# Patient Record
Sex: Male | Born: 1955
Health system: Southern US, Community
[De-identification: ages and names within clinical notes are randomized; demographics above are authoritative.]

## PROBLEM LIST (undated history)

## (undated) DIAGNOSIS — M7120 Synovial cyst of popliteal space [Baker], unspecified knee: Secondary | ICD-10-CM

## (undated) HISTORY — DX: Synovial cyst of popliteal space (Baker), unspecified knee: M71.20

## (undated) HISTORY — PX: DENTAL RESTORATION/EXTRACTION WITH X-RAY: SHX5796

## (undated) HISTORY — PX: OTHER SURGICAL HISTORY: SHX169

---

## 2005-12-29 ENCOUNTER — Ambulatory Visit: Payer: Self-pay | Admitting: Internal Medicine

## 2006-01-05 ENCOUNTER — Ambulatory Visit: Payer: Self-pay | Admitting: Internal Medicine

## 2006-01-26 ENCOUNTER — Ambulatory Visit: Payer: Self-pay | Admitting: Gastroenterology

## 2006-02-10 ENCOUNTER — Ambulatory Visit: Payer: Self-pay | Admitting: Gastroenterology

## 2008-02-21 ENCOUNTER — Encounter: Payer: Self-pay | Admitting: Internal Medicine

## 2008-02-21 DIAGNOSIS — F411 Generalized anxiety disorder: Secondary | ICD-10-CM | POA: Insufficient documentation

## 2008-02-21 DIAGNOSIS — F329 Major depressive disorder, single episode, unspecified: Secondary | ICD-10-CM | POA: Insufficient documentation

## 2009-06-08 ENCOUNTER — Ambulatory Visit: Payer: Self-pay | Admitting: Internal Medicine

## 2009-06-08 LAB — CONVERTED CEMR LAB
ALT: 15 units/L (ref 0–53)
AST: 18 units/L (ref 0–37)
Alkaline Phosphatase: 58 units/L (ref 39–117)
Basophils Absolute: 0.1 10*3/uL (ref 0.0–0.1)
CO2: 31 meq/L (ref 19–32)
Calcium: 9.6 mg/dL (ref 8.4–10.5)
Creatinine, Ser: 1 mg/dL (ref 0.4–1.5)
Eosinophils Absolute: 0.2 10*3/uL (ref 0.0–0.7)
Glucose, Bld: 89 mg/dL (ref 70–99)
Lymphocytes Relative: 33.2 % (ref 12.0–46.0)
MCHC: 34.3 g/dL (ref 30.0–36.0)
Neutro Abs: 3.5 10*3/uL (ref 1.4–7.7)
Neutrophils Relative %: 52.6 % (ref 43.0–77.0)
Platelets: 307 10*3/uL (ref 150.0–400.0)
RDW: 13.9 % (ref 11.5–14.6)
TSH: 1.93 microintl units/mL (ref 0.35–5.50)
Total Bilirubin: 1.5 mg/dL — ABNORMAL HIGH (ref 0.3–1.2)
Total CHOL/HDL Ratio: 4
Triglycerides: 61 mg/dL (ref 0.0–149.0)

## 2010-01-18 ENCOUNTER — Ambulatory Visit: Payer: Self-pay | Admitting: Internal Medicine

## 2010-01-18 ENCOUNTER — Telehealth: Payer: Self-pay | Admitting: Internal Medicine

## 2010-01-18 DIAGNOSIS — M549 Dorsalgia, unspecified: Secondary | ICD-10-CM | POA: Insufficient documentation

## 2010-01-18 LAB — CONVERTED CEMR LAB
Basophils Relative: 0.7 % (ref 0.0–3.0)
Eosinophils Absolute: 0.1 10*3/uL (ref 0.0–0.7)
Lymphocytes Relative: 33.8 % (ref 12.0–46.0)
MCHC: 34.1 g/dL (ref 30.0–36.0)
Neutrophils Relative %: 49.2 % (ref 43.0–77.0)
RBC: 4.03 M/uL — ABNORMAL LOW (ref 4.22–5.81)
WBC: 6.7 10*3/uL (ref 4.5–10.5)

## 2010-01-20 ENCOUNTER — Telehealth: Payer: Self-pay | Admitting: Internal Medicine

## 2010-11-09 NOTE — Assessment & Plan Note (Signed)
Summary: lower back pain-lb   Vital Signs:  Patient profile:   55 year old male Height:      73 inches Weight:      189 pounds BMI:     25.03 O2 Sat:      97 % on Room air Temp:     97.1 degrees F oral Pulse rate:   64 / minute BP sitting:   102 / 78  (left arm) Cuff size:   regular  Vitals Entered By: Bill Salinas CMA (January 18, 2010 4:04 PM)  O2 Flow:  Room air CC: pt here with complaint of lower back pain since march 18th 2011/ ab   Primary Care Provider:  Casimiro Needle e. Faust Thorington  CC:  pt here with complaint of lower back pain since march 18th 2011/ ab.  History of Present Illness: Patient had a root canal March 17th and since then he has had back pain. He attributed this to being in the chair tensed for an hour. He had been started on ibuprofen after the procedure but after he stopped the ibuprofen the pain got worse. Even being very careful with his workouts he has seen no improvement in pain. Today taking 800mg  of ibuprofen he still hurts. He has had no fever or chills. He has some radiation of pain to the buttocks. No weakness. No paresthesia, no loss of control bowel or bladder. The pain is worse after sitting - pain with getting up.   Current Medications (verified): 1)  Multivitamins   Tabs (Multiple Vitamin) .... Take 1 By Mouth Qd 2)  Ibuprofen 200 Mg Tabs (Ibuprofen) .... 4 Tabs As Needed  Allergies (verified): No Known Drug Allergies  Past History:  Past Medical History: Last updated: 02/21/2008 Anxiety Depression  Past Surgical History: Last updated: 02/21/2008 Head skull fracture in 1980 PSH reviewed for relevance, FH reviewed for relevance  Review of Systems  The patient denies anorexia, fever, weight loss, weight gain, chest pain, dyspnea on exertion, peripheral edema, abdominal pain, muscle weakness, difficulty walking, and enlarged lymph nodes.    Physical Exam  General:  WNWD fit appearing white male in no distress Head:  Normocephalic and atraumatic  without obvious abnormalities. No apparent alopecia or balding. Lungs:  normal respiratory effort and normal breath sounds.   Heart:  normal rate and regular rhythm.   Msk:  back exam: nl stand,flex, toe/heel walk and gait, nl step-up, nl SLR sitting and laying, nl DTR patellar, no CVAT. Nl sensation. Pulses:  2+ radial and DP Extremities:  No clubbing, cyanosis, edema, or deformity noted with normal full range of motion of all joints.   Skin:  turgor normal and color normal.   Psych:  Oriented X3, good eye contact, and not anxious appearing.     Impression & Recommendations:  Problem # 1:  BACK PAIN (ICD-724.5) Suspect muscle strain. Exam without radicular findings.  Plan - r/o infection (recent dental work prior to pain) or inflammation           back exercises ( pamphlet provided)           OTC NSIADs as needed - GI precautions provided.  His updated medication list for this problem includes:    Ibuprofen 200 Mg Tabs (Ibuprofen) .Marland KitchenMarland KitchenMarland KitchenMarland Kitchen 4 tabs as needed  Orders: TLB-CBC Platelet - w/Differential (85025-CBCD) TLB-Sedimentation Rate (ESR) (85652-ESR)  Complete Medication List: 1)  Multivitamins Tabs (Multiple vitamin) .... Take 1 by mouth qd 2)  Ibuprofen 200 Mg Tabs (Ibuprofen) .... 4 tabs as needed  Patient: Scott Sherman Note: All result statuses are Final unless otherwise noted.  Tests: (1) CBC Platelet w/Diff (CBCD)   White Cell Count          6.7 K/uL                    4.5-10.5   Red Cell Count       [L]  4.03 Mil/uL                 4.22-5.81   Hemoglobin           [L]  12.6 g/dL                   04.5-40.9   Hematocrit           [L]  37.0 %                      39.0-52.0   MCV                       91.8 fl                     78.0-100.0   MCHC                      34.1 g/dL                   81.1-91.4   RDW                       14.1 %                      11.5-14.6   Platelet Count            285.0 K/uL                  150.0-400.0   Neutrophil %              49.2  %                      43.0-77.0   Lymphocyte %              33.8 %                      12.0-46.0   Monocyte %           [H]  14.1 %                      3.0-12.0   Eosinophils%              2.2 %                       0.0-5.0   Basophils %               0.7 %                       0.0-3.0   Neutrophill Absolute      3.3 K/uL                    1.4-7.7   Lymphocyte Absolute       2.3 K/uL  0.7-4.0   Monocyte Absolute         0.9 K/uL                    0.1-1.0  Eosinophils, Absolute                             0.1 K/uL                    0.0-0.7   Basophils Absolute        0.0 K/uL                    0.0-0.1  Tests: (2) Sed Rate (ESR)   Sed Rate                  19 mm/hr                    0-22

## 2010-11-09 NOTE — Progress Notes (Signed)
Summary: Needs OV  Phone Note Call from Patient   Summary of Call: Pt c/o low back pain since march 18th, getting worse. Pt needs office visit soon for eval w/Norins in open apt please.  Initial call taken by: Lamar Sprinkles, CMA,  January 18, 2010 11:09 AM  Follow-up for Phone Call        left pt a message to call office back. Follow-up by: Verdell Face,  January 18, 2010 11:42 AM  Additional Follow-up for Phone Call Additional follow up Details #1::        pt is here today Additional Follow-up by: Ami Bullins CMA,  January 18, 2010 3:40 PM

## 2010-11-09 NOTE — Progress Notes (Signed)
  Phone Note Outgoing Call   Reason for Call: Discuss lab or test results Summary of Call: please call: labs are 100% normal  (CBC, Sed rate)  Thanks Initial call taken by: Jacques Navy MD,  January 20, 2010 5:41 AM  Follow-up for Phone Call        informed pt of lab results Follow-up by: Ami Bullins CMA,  January 20, 2010 8:52 AM

## 2012-09-11 ENCOUNTER — Encounter: Payer: Self-pay | Admitting: Internal Medicine

## 2012-09-11 ENCOUNTER — Other Ambulatory Visit (INDEPENDENT_AMBULATORY_CARE_PROVIDER_SITE_OTHER): Payer: BC Managed Care – PPO

## 2012-09-11 ENCOUNTER — Ambulatory Visit (INDEPENDENT_AMBULATORY_CARE_PROVIDER_SITE_OTHER): Payer: BC Managed Care – PPO | Admitting: Internal Medicine

## 2012-09-11 VITALS — BP 116/80 | HR 77 | Temp 98.0°F | Resp 16 | Ht 73.0 in | Wt 197.2 lb

## 2012-09-11 DIAGNOSIS — Z0000 Encounter for general adult medical examination without abnormal findings: Secondary | ICD-10-CM

## 2012-09-11 DIAGNOSIS — Z125 Encounter for screening for malignant neoplasm of prostate: Secondary | ICD-10-CM

## 2012-09-11 DIAGNOSIS — Z23 Encounter for immunization: Secondary | ICD-10-CM

## 2012-09-11 LAB — HEPATIC FUNCTION PANEL
ALT: 14 U/L (ref 0–53)
AST: 19 U/L (ref 0–37)
Albumin: 4.2 g/dL (ref 3.5–5.2)
Total Protein: 6.9 g/dL (ref 6.0–8.3)

## 2012-09-11 LAB — COMPREHENSIVE METABOLIC PANEL
ALT: 14 U/L (ref 0–53)
Albumin: 4.2 g/dL (ref 3.5–5.2)
Alkaline Phosphatase: 56 U/L (ref 39–117)
BUN: 17 mg/dL (ref 6–23)
CO2: 28 mEq/L (ref 19–32)
Chloride: 102 mEq/L (ref 96–112)
Total Bilirubin: 1.2 mg/dL (ref 0.3–1.2)
Total Protein: 6.9 g/dL (ref 6.0–8.3)

## 2012-09-11 LAB — LIPID PANEL
Cholesterol: 200 mg/dL (ref 0–200)
LDL Cholesterol: 134 mg/dL — ABNORMAL HIGH (ref 0–99)
Triglycerides: 48 mg/dL (ref 0.0–149.0)

## 2012-09-11 LAB — PSA: PSA: 1.7 ng/mL (ref 0.10–4.00)

## 2012-09-11 NOTE — Patient Instructions (Addendum)
Normal exam. Labs are ordered and you will receive a full report from today's visit and lab results. You may also use MyChart  Tdap and flu today.  Good to see you. Looks like you landed on your feet!

## 2012-09-11 NOTE — Progress Notes (Signed)
Subjective:    Patient ID: Scott Sherman, male    DOB: Feb 17, 1956, 56 y.o.   MRN: [id number]  HPI Scott Sherman presents for annual DOT exam and physical exam. He has been healthy and doing well. Dr. Elmer Picker sees him for macular degeneration. He has seen the dentist in the last 6 months. Works out 1-1.5 hrs at [employer] 3 times a week. 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   Review of Systems Constitutional:  Negative for fever, chills, activity change and unexpected weight change.  HEENT:  Negative for hearing loss, ear pain, congestion, neck stiffness and postnasal drip. Negative for sore throat or swallowing problems. Negative for dental complaints.   Eyes: Negative for vision loss or change in visual acuity.  Respiratory: Negative for chest tightness and wheezing. Negative for DOE.   Cardiovascular: Negative for chest pain or palpitations. No decreased exercise tolerance Gastrointestinal: No change in bowel habit. No bloating or gas. No reflux or indigestion Genitourinary: Negative for urgency, frequency, flank pain and difficulty urinating.  Musculoskeletal: Negative for myalgias, back pain, arthralgias and gait problem.   Neurological: Negative for dizziness, tremors, weakness and headaches.  Hematological: Negative for adenopathy.  Psychiatric/Behavioral: Negative for behavioral problems and dysphoric mood.       Objective:   Physical Exam Filed Vitals:   09/11/12 1027  BP: 116/80  Pulse: 77  Temp: 98 F (36.7 C)  Resp: 16   Wt Readings from Last 3 Encounters:  09/11/12 197 lb 4 oz (89.472 kg)  01/18/10 189 lb (85.73 kg)  06/08/09 185 lb (83.915 kg)   Gen'l: Well nourished well developed White male in no acute distress  HEENT: Head: Normocephalic and atraumatic. Right Ear: External ear normal. EAC/TM nl. Left Ear: External ear normal.  EAC/TM nl. Nose: Nose normal. Mouth/Throat: Oropharynx is clear and moist. Dentition - native, in good repair. No buccal or palatal lesions. Posterior pharynx clear. Eyes: Conjunctivae and sclera clear. EOM intact. Pupils are equal, round, and reactive to light. Right eye exhibits no discharge. Left eye exhibits no discharge. Neck: Normal range of motion. Neck supple. No JVD present. No tracheal deviation present. No thyromegaly present.  Cardiovascular: Normal rate, regular rhythm, no gallop, no friction rub, no murmur heard.      Quiet precordium. 2+ radial and DP pulses . No carotid bruits Pulmonary/Chest: Effort normal. No respiratory distress or increased WOB, no wheezes, no rales. No chest wall deformity or CVAT. Abdomen: Soft. Bowel sounds are normal in all quadrants. He exhibits no distension, no tenderness, no rebound or guarding, No heptosplenomegaly  Genitourinary:  deferred Musculoskeletal: Normal range of motion. He exhibits no edema and no tenderness.       Small and large joints without redness, synovial thickening or  deformity. Full range of motion preserved about all small, median and large joints.  Lymphadenopathy:    He has no cervical or supraclavicular adenopathy.  Neurological: He is alert and oriented to person, place, and time. CN II-XII  intact. DTRs 2+ and symmetrical biceps, radial and patellar tendons. Cerebellar function normal with no tremor, rigidity, normal gait and station.  Skin: Skin is warm and dry. No rash noted. No erythema.  Psychiatric: He has a normal mood and affect. His behavior is normal. Thought content normal.   Lab Results  Component Value Date   WBC 6.7 01/18/2010   HGB 12.6* 01/18/2010   HCT 37.0* 01/18/2010   PLT 285.0 01/18/2010   GLUCOSE 105* 09/11/2012   CHOL 200 09/11/2012   TRIG 48.0 09/11/2012   HDL 56.50 09/11/2012   LDLCALC 134* 09/11/2012        ALT 14 09/11/2012   AST 19 09/11/2012        NA 138 09/11/2012   K 4.4 09/11/2012   CL 102 09/11/2012   CREATININE 1.0 09/11/2012   BUN 17 09/11/2012   CO2 28 09/11/2012   TSH 1.93 06/08/2009   PSA 1.70 09/11/2012           Assessment & Plan:

## 2012-09-14 DIAGNOSIS — Z0000 Encounter for general adult medical examination without abnormal findings: Secondary | ICD-10-CM | POA: Insufficient documentation

## 2012-09-14 NOTE — Assessment & Plan Note (Signed)
Interval medical history is benign. Physical exam is normal. Labs are in normal limites with a minimally elevated LDL cholesterol with goal of 130 or less. He is given Tdap today. Will check old records for colonoscopy.   IN summary - a nice man who invests in his health with regular exercise and healthy diet. He enjoys his work and that is going well well. He will return in 8-24 months or sooner as needed.

## 2012-09-20 ENCOUNTER — Encounter: Payer: Self-pay | Admitting: Internal Medicine

## 2012-09-25 ENCOUNTER — Other Ambulatory Visit: Payer: Self-pay | Admitting: Internal Medicine

## 2012-09-25 DIAGNOSIS — Z1211 Encounter for screening for malignant neoplasm of colon: Secondary | ICD-10-CM

## 2012-12-11 ENCOUNTER — Telehealth: Payer: Self-pay

## 2012-12-11 NOTE — Telephone Encounter (Signed)
Pt called requesting a referral for persistent knee, leg, and shoulder pain. Pt was not specific on the type of physician - whoever [facility] suggests.

## 2012-12-11 NOTE — Telephone Encounter (Signed)
Needs an ov  In order to know what type of referral is appropriate.

## 2012-12-12 NOTE — Telephone Encounter (Signed)
Pt called back.  Made an appt for Thurs afternoon.

## 2012-12-13 ENCOUNTER — Encounter: Payer: Self-pay | Admitting: Internal Medicine

## 2012-12-13 ENCOUNTER — Ambulatory Visit (INDEPENDENT_AMBULATORY_CARE_PROVIDER_SITE_OTHER): Payer: BC Managed Care – PPO | Admitting: Internal Medicine

## 2012-12-13 ENCOUNTER — Ambulatory Visit (INDEPENDENT_AMBULATORY_CARE_PROVIDER_SITE_OTHER)
Admission: RE | Admit: 2012-12-13 | Discharge: 2012-12-13 | Disposition: A | Discharge status: Home / Self Care | Payer: BC Managed Care – PPO | Source: Ambulatory Visit | Attending: Internal Medicine | Admitting: Internal Medicine

## 2012-12-13 VITALS — BP 138/90 | HR 80 | Temp 97.6°F | Resp 12 | Wt 194.0 lb

## 2012-12-13 DIAGNOSIS — M25512 Pain in left shoulder: Secondary | ICD-10-CM

## 2012-12-13 DIAGNOSIS — M79604 Pain in right leg: Secondary | ICD-10-CM

## 2012-12-13 DIAGNOSIS — M79609 Pain in unspecified limb: Secondary | ICD-10-CM

## 2012-12-13 DIAGNOSIS — M25519 Pain in unspecified shoulder: Secondary | ICD-10-CM

## 2012-12-13 NOTE — Progress Notes (Signed)
  Subjective:    Patient ID: Scott Sherman, male    DOB: 1955/11/29, 57 y.o.   MRN: [id number]  HPI Scott Sherman presents for a several month problem with pain in the muscles of the right leg: the quadraceps and the lateral lower leg are the areas most affected. Worse with exercise. Relieved with lineament. He had a previous injury several years ago with a Baker's cyst seen by Dr. Althea Charon and treated with rest. He has no paresthesia, no loss of strength, no collapse of the knee.   For several weeks he has had pain in the left shoulder that involves the neck, trapezius group and the proximal UE. No crepitus, no injury. Exercise can make it worse and he has pain with adduction and rotation. No loss of strength, no paresthesia.  PMH, FamHx and SocHx reviewed for any changes and relevance.  No current outpatient prescriptions on file prior to visit.   No current facility-administered medications on file prior to visit.       Review of Systems System review is negative for any constitutional, cardiac, pulmonary, GI or neuro symptoms or complaints other than as described in the HPI.     Objective:   Physical Exam Filed Vitals:   12/13/12 1330  BP: 138/90  Pulse: 80  Temp: 97.6 F (36.4 C)  Resp: 12   Gen'- WNWD white man in no distress HEENT_ C&S clear Cor- 2+ radial pulse, RRR Pulm - normal respirations MSK - right knee with normal full ROM, no crepitus or click, negative drawer sign, no medial or lateral instability. Tender to palpation vastus lateralis at the insertion, tender proximal lateral calve. Left shoulder with full active and passive ROM, no crepitus.  Neuro - left UE with normal DTRs, normal sensation and strength.       Assessment & Plan:

## 2012-12-13 NOTE — Patient Instructions (Addendum)
1. Right leg pain - knee joint seems normal without click or crepitus or instability. There is tenderness in the quads and the proximal right Lower extremity. Back exam is negative for an signs of disk injury or nerve compression.  Plan Rub of choice  Heat will be helpful to these muscles  Reduce work out burden  Referral to Dr. Lita Mains  2. Shoulder pain - shoulder moves well. Doubt joint disease or rotator cuff tear. With involvement of the trapezius muscles, upper back and upper arm we do need to evaluate the cervical spine.  Plan c-spine series  Lineament and perhaps anti-inflammatory medication.

## 2012-12-15 DIAGNOSIS — M79604 Pain in right leg: Secondary | ICD-10-CM | POA: Insufficient documentation

## 2012-12-15 DIAGNOSIS — M25512 Pain in left shoulder: Secondary | ICD-10-CM | POA: Insufficient documentation

## 2012-12-15 NOTE — Assessment & Plan Note (Signed)
No evidence of joint involvement at the knee. Muscle/tendon tenderness. No palpable Baker's cyst. Suspect muscle/tendon strain.  Plan  NSAID of choice  Lineament and massage, heat  Referral to ortho/sports medicine - Dr. Althea Charon

## 2012-12-15 NOTE — Assessment & Plan Note (Signed)
Left shoulder pain involving the trapezius, scapularis and proximal UE muscle groups. Shoulder joint stable. Differential: C-spine related pain vs bursitis with radiation of pain.  PLan C-spine series.   NSAIDs of choice   Lineament and massage, heat  Sports medicine referral for failure to improve.  Reduce work load at [employer].  Addendum -  C-Spine series: mild DDD, worse at C6-7; mild DJD

## 2013-06-28 ENCOUNTER — Ambulatory Visit (INDEPENDENT_AMBULATORY_CARE_PROVIDER_SITE_OTHER): Payer: BC Managed Care – PPO | Admitting: Family Medicine

## 2013-06-28 VITALS — BP 102/68 | HR 70 | Temp 98.8°F | Resp 18 | Ht 72.0 in | Wt 188.2 lb

## 2013-06-28 DIAGNOSIS — H9209 Otalgia, unspecified ear: Secondary | ICD-10-CM

## 2013-06-28 DIAGNOSIS — T169XXA Foreign body in ear, unspecified ear, initial encounter: Secondary | ICD-10-CM

## 2013-06-28 DIAGNOSIS — T161XXA Foreign body in right ear, initial encounter: Secondary | ICD-10-CM

## 2013-06-28 NOTE — Patient Instructions (Addendum)
Take care, good to see you today.  Let us know if you have any problems.

## 2013-06-28 NOTE — Progress Notes (Signed)
[facility] [address] [place] [address] [phone]  Date:  06/28/2013   Name:  Scott Sherman   DOB:  1956-03-23   MRN:  [id number]  PCP:  Illene Regulus, MD    Chief Complaint: Otitis Media   History of Present Illness:  Scott Sherman is a 57 y.o. very pleasant male patient who presents with the following:  He is concerned there might be something stuck in his ear.  He uses q-tips daily. He thinks a cotton part came off in his ear this morning- he is not sure if it is still in there.  His wife tried to get it out, and used an Airline pilot but no result.   He is not sure if it is still there, but he would like for [place] to look.  His hearing is ok and he feels fine  Patient Active Problem List   Diagnosis Date Noted  . Pain of right leg 12/15/2012  . Left shoulder pain 12/15/2012  . Routine health maintenance 09/14/2012  . BACK PAIN 01/18/2010    Past Medical History  Diagnosis Date  . Baker's cyst of knee     right knee    History reviewed. No pertinent past surgical history.  History  Substance Use Topics  . Smoking status: Never Smoker   . Smokeless tobacco: Never Used  . Alcohol Use: 1.5 - 2 oz/week    3-4 drink(s) per week     Comment: occasional beer    Family History  Problem Relation Age of Onset  . Cancer Maternal Aunt   . Arthritis Mother     No Known Allergies  Medication list has been reviewed and updated.  No current outpatient prescriptions on file prior to visit.   No current facility-administered medications on file prior to visit.    Review of Systems:  As per HPI- otherwise negative.   Physical Examination: Filed Vitals:   06/28/13 1345  BP: 102/68  Pulse: 70  Temp: 98.8 F (37.1 C)  Resp: 18   Filed Vitals:   06/28/13 1345  Height: 6' (1.829 m)  Weight: 188 lb 3.2 oz (85.367 kg)    Ideal Body Weight: Weight in (lb) to have BMI = 25: 183.9  GEN: WDWN, NAD, Non-toxic, A & O x 3 HEENT:  Atraumatic, Normocephalic. Neck supple. No masses, No LAD.  PEERL, EOMI, oropharynx wnl, left ear normal. See below Ears and Nose: No external deformity. CV: RRR, No M/G/R. No JVD. No thrill. No extra heart sounds. PULM: CTA B, no wheezes, crackles, rhonchi. No retractions. No resp. distress. No accessory muscle use. EXTR: No c/c/e NEURO Normal gait.  PSYCH: Normally interactive. Conversant. Not depressed or anxious appearing.  Calm demeanor.  Cotton stuck in right ear canal.  Removed with aligator forceps, removed all cotton, no complications  TM and ear canal otherwise normal  Assessment and Plan: FB ear, right, initial encounter  Removed cotton from ear.  Follow-up if needed  Signed Abbe Amsterdam, MD

## 2013-08-15 ENCOUNTER — Other Ambulatory Visit: Payer: Self-pay

## 2015-12-17 ENCOUNTER — Encounter: Payer: Self-pay | Admitting: Gastroenterology

## 2016-03-11 ENCOUNTER — Encounter: Payer: Self-pay | Admitting: Gastroenterology

## 2016-04-22 ENCOUNTER — Encounter: Payer: Self-pay | Admitting: Gastroenterology

## 2016-04-26 ENCOUNTER — Encounter: Payer: Self-pay | Admitting: Gastroenterology

## 2016-04-26 ENCOUNTER — Ambulatory Visit (AMBULATORY_SURGERY_CENTER): Payer: Self-pay

## 2016-04-26 VITALS — Ht 73.0 in | Wt 195.6 lb

## 2016-04-26 DIAGNOSIS — Z1211 Encounter for screening for malignant neoplasm of colon: Secondary | ICD-10-CM

## 2016-04-26 DIAGNOSIS — Z860100 Personal history of colon polyps, unspecified: Secondary | ICD-10-CM

## 2016-04-26 MED ORDER — SUPREP BOWEL PREP KIT 17.5-3.13-1.6 GM/177ML PO SOLN: 1.0000 | Freq: Once | ORAL | Status: DC

## 2016-04-26 NOTE — Progress Notes (Signed)
No allergies to eggs or soy No diet meds No home oxygen No past problems with anesthesia  Has email and internet; declined emmi 

## 2016-05-10 ENCOUNTER — Encounter: Payer: Self-pay | Admitting: Gastroenterology

## 2016-05-10 ENCOUNTER — Ambulatory Visit (AMBULATORY_SURGERY_CENTER): Payer: 59 | Admitting: Gastroenterology

## 2016-05-10 VITALS — BP 105/56 | HR 69 | Temp 98.4°F | Resp 17 | Ht 73.0 in | Wt 195.0 lb

## 2016-05-10 DIAGNOSIS — Z1211 Encounter for screening for malignant neoplasm of colon: Secondary | ICD-10-CM | POA: Diagnosis not present

## 2016-05-10 DIAGNOSIS — Z1212 Encounter for screening for malignant neoplasm of rectum: Secondary | ICD-10-CM | POA: Diagnosis not present

## 2016-05-10 MED ORDER — SODIUM CHLORIDE 0.9 % IV SOLN: 500.0000 mL | INTRAVENOUS | Status: DC

## 2016-05-10 NOTE — Op Note (Signed)
[facility] Patient Name: Scott Sherman Procedure Date: 05/10/2016 10:27 AM MRN: [id number] Endoscopist: Ladene Artist , MD Age: 60 Referring MD:  Date of Birth: [birth year] Gender: Male Account #: [id number] Procedure:                Colonoscopy Indications:              Screening for colorectal malignant neoplasm, Last                            colonoscopy 10 years ago Medicines:                Monitored Anesthesia Care Procedure:                Pre-Anesthesia Assessment:                           - Prior to the procedure, a History and Physical                            was performed, and patient medications and                            allergies were reviewed. The patient's tolerance of                            previous anesthesia was also reviewed. The risks                            and benefits of the procedure and the sedation                            options and risks were discussed with the patient.                            All questions were answered, and informed consent                            was obtained. Prior Anticoagulants: The patient has                            taken no previous anticoagulant or antiplatelet                            agents. ASA Grade Assessment: II - A patient with                            mild systemic disease. After reviewing the risks                            and benefits, the patient was deemed in                            satisfactory condition to undergo the procedure.  After obtaining informed consent, the colonoscope                            was passed under direct vision. Throughout the                            procedure, the patient's blood pressure, pulse, and                            oxygen saturations were monitored continuously. The                            Model PCF-H190L [id number]) scope was introduced                            through the anus and advanced to  the the cecum,                            identified by appendiceal orifice and ileocecal                            valve. The ileocecal valve, appendiceal orifice,                            and rectum were photographed. The quality of the                            bowel preparation was excellent. The colonoscopy                            was performed without difficulty. The patient                            tolerated the procedure well. Scope In: 10:35:38 AM Scope Out: 10:46:07 AM Scope Withdrawal Time: 0 hours 8 minutes 36 seconds  Total Procedure Duration: 0 hours 10 minutes 29 seconds  Findings:                 Internal hemorrhoids were found during                            retroflexion. The hemorrhoids were small and Grade                            I (internal hemorrhoids that do not prolapse).                           The exam was otherwise without abnormality on                            direct and retroflexion views. Complications:            No immediate complications. Estimated blood loss:  None. Estimated Blood Loss:     Estimated blood loss: none. Impression:               - Internal hemorrhoids.                           - The examination was otherwise normal on direct                            and retroflexion views.                           - No specimens collected. Recommendation:           - Repeat colonoscopy in 10 years for screening                            purposes.                           - Patient has a contact number available for                            emergencies. The signs and symptoms of potential                            delayed complications were discussed with the                            patient. Return to normal activities tomorrow.                            Written discharge instructions were provided to the                            patient.                           - Resume previous diet.                            - Continue present medications. Ladene Artist, MD 05/10/2016 10:49:45 AM This report has been signed electronically.

## 2016-05-10 NOTE — Patient Instructions (Signed)
YOU HAD AN ENDOSCOPIC PROCEDURE TODAY AT [facility]:   Refer to the procedure report that was given to you for any specific questions about what was found during the examination.  If the procedure report does not answer your questions, please call your gastroenterologist to clarify.  If you requested that your care partner not be given the details of your procedure findings, then the procedure report has been included in a sealed envelope for you to review at your convenience later.  YOU SHOULD EXPECT: Some feelings of bloating in the abdomen. Passage of more gas than usual.  Walking can help get rid of the air that was put into your GI tract during the procedure and reduce the bloating. If you had a lower endoscopy (such as a colonoscopy or flexible sigmoidoscopy) you may notice spotting of blood in your stool or on the toilet paper. If you underwent a bowel prep for your procedure, you may not have a normal bowel movement for a few days.  Please Note:  You might notice some irritation and congestion in your nose or some drainage.  This is from the oxygen used during your procedure.  There is no need for concern and it should clear up in a day or so.  SYMPTOMS TO REPORT IMMEDIATELY:   Following lower endoscopy (colonoscopy or flexible sigmoidoscopy):  Excessive amounts of blood in the stool  Significant tenderness or worsening of abdominal pains  Swelling of the abdomen that is new, acute  Fever of 100F or higher   For urgent or emergent issues, a gastroenterologist can be reached at any hour by calling [phone].   DIET: Your first meal following the procedure should be a small meal and then it is ok to progress to your normal diet. Heavy or fried foods are harder to digest and may make you feel nauseous or bloated.  Likewise, meals heavy in dairy and vegetables can increase bloating.  Drink plenty of fluids but you should avoid alcoholic beverages for 24  hours.  ACTIVITY:  You should plan to take it easy for the rest of today and you should NOT DRIVE or use heavy machinery until tomorrow (because of the sedation medicines used during the test).    FOLLOW UP: Our staff will call the number listed on your records the next business day following your procedure to check on you and address any questions or concerns that you may have regarding the information given to you following your procedure. If we do not reach you, we will leave a message.  However, if you are feeling well and you are not experiencing any problems, there is no need to return our call.  We will assume that you have returned to your regular daily activities without incident.  If any biopsies were taken you will be contacted by phone or by letter within the next 1-3 weeks.  Please call us at [phone] if you have not heard about the biopsies in 3 weeks.    SIGNATURES/CONFIDENTIALITY: You and/or your care partner have signed paperwork which will be entered into your electronic medical record.  These signatures attest to the fact that that the information above on your After Visit Summary has been reviewed and is understood.  Full responsibility of the confidentiality of this discharge information lies with you and/or your care-partner.   Continue present medications. Hemorrhoids handout provided.

## 2016-05-10 NOTE — Progress Notes (Signed)
A/ox3 pleased with MAC, report to karen RN

## 2016-05-11 ENCOUNTER — Telehealth: Payer: Self-pay | Admitting: *Deleted

## 2016-05-11 NOTE — Telephone Encounter (Signed)
No answer. Left message to call if questions or concerns. 

## 2016-10-13 DIAGNOSIS — H353221 Exudative age-related macular degeneration, left eye, with active choroidal neovascularization: Secondary | ICD-10-CM | POA: Diagnosis not present

## 2016-11-10 DIAGNOSIS — L570 Actinic keratosis: Secondary | ICD-10-CM | POA: Diagnosis not present

## 2016-11-24 DIAGNOSIS — H353221 Exudative age-related macular degeneration, left eye, with active choroidal neovascularization: Secondary | ICD-10-CM | POA: Diagnosis not present

## 2016-12-28 DIAGNOSIS — H353221 Exudative age-related macular degeneration, left eye, with active choroidal neovascularization: Secondary | ICD-10-CM | POA: Diagnosis not present

## 2016-12-28 DIAGNOSIS — H353133 Nonexudative age-related macular degeneration, bilateral, advanced atrophic without subfoveal involvement: Secondary | ICD-10-CM | POA: Diagnosis not present

## 2017-01-20 DIAGNOSIS — H40013 Open angle with borderline findings, low risk, bilateral: Secondary | ICD-10-CM | POA: Diagnosis not present

## 2017-02-02 DIAGNOSIS — H353221 Exudative age-related macular degeneration, left eye, with active choroidal neovascularization: Secondary | ICD-10-CM | POA: Diagnosis not present

## 2017-03-23 DIAGNOSIS — H353133 Nonexudative age-related macular degeneration, bilateral, advanced atrophic without subfoveal involvement: Secondary | ICD-10-CM | POA: Diagnosis not present

## 2017-03-23 DIAGNOSIS — H353222 Exudative age-related macular degeneration, left eye, with inactive choroidal neovascularization: Secondary | ICD-10-CM | POA: Diagnosis not present

## 2017-03-23 DIAGNOSIS — H3554 Dystrophies primarily involving the retinal pigment epithelium: Secondary | ICD-10-CM | POA: Diagnosis not present

## 2017-05-18 DIAGNOSIS — H353133 Nonexudative age-related macular degeneration, bilateral, advanced atrophic without subfoveal involvement: Secondary | ICD-10-CM | POA: Diagnosis not present

## 2017-05-18 DIAGNOSIS — H3554 Dystrophies primarily involving the retinal pigment epithelium: Secondary | ICD-10-CM | POA: Diagnosis not present

## 2017-05-18 DIAGNOSIS — H353222 Exudative age-related macular degeneration, left eye, with inactive choroidal neovascularization: Secondary | ICD-10-CM | POA: Diagnosis not present

## 2017-06-14 DIAGNOSIS — L570 Actinic keratosis: Secondary | ICD-10-CM | POA: Diagnosis not present

## 2017-06-14 DIAGNOSIS — D2262 Melanocytic nevi of left upper limb, including shoulder: Secondary | ICD-10-CM | POA: Diagnosis not present

## 2017-06-14 DIAGNOSIS — L814 Other melanin hyperpigmentation: Secondary | ICD-10-CM | POA: Diagnosis not present

## 2017-06-14 DIAGNOSIS — D1801 Hemangioma of skin and subcutaneous tissue: Secondary | ICD-10-CM | POA: Diagnosis not present

## 2017-06-14 DIAGNOSIS — D485 Neoplasm of uncertain behavior of skin: Secondary | ICD-10-CM | POA: Diagnosis not present

## 2017-06-14 DIAGNOSIS — L821 Other seborrheic keratosis: Secondary | ICD-10-CM | POA: Diagnosis not present

## 2017-08-16 DIAGNOSIS — H25013 Cortical age-related cataract, bilateral: Secondary | ICD-10-CM | POA: Diagnosis not present

## 2017-08-16 DIAGNOSIS — H2513 Age-related nuclear cataract, bilateral: Secondary | ICD-10-CM | POA: Diagnosis not present

## 2017-08-16 DIAGNOSIS — H40013 Open angle with borderline findings, low risk, bilateral: Secondary | ICD-10-CM | POA: Diagnosis not present

## 2017-08-16 DIAGNOSIS — H353221 Exudative age-related macular degeneration, left eye, with active choroidal neovascularization: Secondary | ICD-10-CM | POA: Diagnosis not present

## 2017-08-22 DIAGNOSIS — H353132 Nonexudative age-related macular degeneration, bilateral, intermediate dry stage: Secondary | ICD-10-CM | POA: Diagnosis not present

## 2017-08-22 DIAGNOSIS — H353222 Exudative age-related macular degeneration, left eye, with inactive choroidal neovascularization: Secondary | ICD-10-CM | POA: Diagnosis not present

## 2017-08-22 DIAGNOSIS — H3554 Dystrophies primarily involving the retinal pigment epithelium: Secondary | ICD-10-CM | POA: Diagnosis not present

## 2017-11-27 DIAGNOSIS — R42 Dizziness and giddiness: Secondary | ICD-10-CM | POA: Diagnosis not present

## 2017-11-27 DIAGNOSIS — J069 Acute upper respiratory infection, unspecified: Secondary | ICD-10-CM | POA: Diagnosis not present

## 2018-03-01 DIAGNOSIS — H353222 Exudative age-related macular degeneration, left eye, with inactive choroidal neovascularization: Secondary | ICD-10-CM | POA: Diagnosis not present

## 2018-03-01 DIAGNOSIS — H3554 Dystrophies primarily involving the retinal pigment epithelium: Secondary | ICD-10-CM | POA: Diagnosis not present

## 2018-03-01 DIAGNOSIS — H353132 Nonexudative age-related macular degeneration, bilateral, intermediate dry stage: Secondary | ICD-10-CM | POA: Diagnosis not present

## 2018-03-30 DIAGNOSIS — R531 Weakness: Secondary | ICD-10-CM | POA: Diagnosis not present

## 2018-03-30 DIAGNOSIS — R42 Dizziness and giddiness: Secondary | ICD-10-CM | POA: Diagnosis not present

## 2018-04-13 DIAGNOSIS — R0982 Postnasal drip: Secondary | ICD-10-CM | POA: Diagnosis not present

## 2018-04-13 DIAGNOSIS — H8110 Benign paroxysmal vertigo, unspecified ear: Secondary | ICD-10-CM | POA: Diagnosis not present

## 2018-07-11 DIAGNOSIS — Z23 Encounter for immunization: Secondary | ICD-10-CM | POA: Diagnosis not present

## 2018-07-11 DIAGNOSIS — M779 Enthesopathy, unspecified: Secondary | ICD-10-CM | POA: Diagnosis not present

## 2018-09-03 DIAGNOSIS — H353132 Nonexudative age-related macular degeneration, bilateral, intermediate dry stage: Secondary | ICD-10-CM | POA: Diagnosis not present

## 2018-09-03 DIAGNOSIS — H3554 Dystrophies primarily involving the retinal pigment epithelium: Secondary | ICD-10-CM | POA: Diagnosis not present

## 2018-09-03 DIAGNOSIS — H353222 Exudative age-related macular degeneration, left eye, with inactive choroidal neovascularization: Secondary | ICD-10-CM | POA: Diagnosis not present

## 2018-10-11 DIAGNOSIS — H353132 Nonexudative age-related macular degeneration, bilateral, intermediate dry stage: Secondary | ICD-10-CM | POA: Diagnosis not present

## 2018-10-11 DIAGNOSIS — H3554 Dystrophies primarily involving the retinal pigment epithelium: Secondary | ICD-10-CM | POA: Diagnosis not present

## 2018-10-11 DIAGNOSIS — H40013 Open angle with borderline findings, low risk, bilateral: Secondary | ICD-10-CM | POA: Diagnosis not present

## 2019-03-05 DIAGNOSIS — H353222 Exudative age-related macular degeneration, left eye, with inactive choroidal neovascularization: Secondary | ICD-10-CM | POA: Diagnosis not present

## 2019-03-05 DIAGNOSIS — H353132 Nonexudative age-related macular degeneration, bilateral, intermediate dry stage: Secondary | ICD-10-CM | POA: Diagnosis not present

## 2019-03-05 DIAGNOSIS — H3554 Dystrophies primarily involving the retinal pigment epithelium: Secondary | ICD-10-CM | POA: Diagnosis not present

## 2020-02-24 ENCOUNTER — Ambulatory Visit
Admission: RE | Admit: 2020-02-24 | Discharge: 2020-02-24 | Disposition: A | Discharge status: Home / Self Care | Payer: 59 | Source: Ambulatory Visit | Attending: Internal Medicine | Admitting: Internal Medicine

## 2020-02-24 ENCOUNTER — Other Ambulatory Visit: Payer: Self-pay | Admitting: Internal Medicine

## 2020-02-24 DIAGNOSIS — R059 Cough, unspecified: Secondary | ICD-10-CM

## 2020-02-25 ENCOUNTER — Other Ambulatory Visit: Payer: Self-pay

## 2020-02-25 ENCOUNTER — Ambulatory Visit (INDEPENDENT_AMBULATORY_CARE_PROVIDER_SITE_OTHER): Payer: 59 | Admitting: Ophthalmology

## 2020-02-25 ENCOUNTER — Encounter (INDEPENDENT_AMBULATORY_CARE_PROVIDER_SITE_OTHER): Payer: Self-pay | Admitting: Ophthalmology

## 2020-02-25 DIAGNOSIS — H353222 Exudative age-related macular degeneration, left eye, with inactive choroidal neovascularization: Secondary | ICD-10-CM

## 2020-02-25 DIAGNOSIS — H353132 Nonexudative age-related macular degeneration, bilateral, intermediate dry stage: Secondary | ICD-10-CM | POA: Diagnosis not present

## 2020-02-25 DIAGNOSIS — H3554 Dystrophies primarily involving the retinal pigment epithelium: Secondary | ICD-10-CM

## 2020-02-25 NOTE — Progress Notes (Signed)
02/25/2020     CHIEF COMPLAINT Patient presents for Retina Follow Up   HISTORY OF PRESENT ILLNESS: Scott Sherman is a 64 y.o. male who presents to the clinic today for:   HPI    Retina Follow Up    Patient presents with  Dry AMD.  In both eyes.  This started 6 months ago.  Severity is mild.  Duration of 6 months.  Since onset it is stable.          Comments    6 Month AMD F/U OU  Pt denies noticeable changes to [place] OU since last visit. Pt denies ocular pain, flashes of light, or floaters OU.         Last edited by Rockie Neighbours, Will on 02/25/2020  3:33 PM. (History)      Referring physician: Wenda Low, MD [address],  [place] [id number]  HISTORICAL INFORMATION:   Selected notes from the [facility]: No current outpatient medications on file. (Ophthalmic Drugs)   No current facility-administered medications for this visit. (Ophthalmic Drugs)   Current Outpatient Medications (Other)  Medication Sig  . cetirizine (ZYRTEC) 10 MG tablet Take 10 mg by mouth daily.  Marland Kitchen glucosamine-chondroitin 500-400 MG tablet Take 1 tablet by mouth daily. 1500MG   . multivitamin-lutein (OCUVITE-LUTEIN) CAPS capsule Take 1 capsule by mouth daily. AND ZEAXANTHIN  . Omega-3 Fatty Acids (FISH OIL) 1360 MG CAPS Take by mouth. OMEGA 3  . OVER THE COUNTER MEDICATION SUPER B COMPLEX 1 DAILY  . OVER THE COUNTER MEDICATION EQUATE DAILY FIBER 1 DAILY   Current Facility-Administered Medications (Other)  Medication Route  . 0.9 %  sodium chloride infusion Intravenous      REVIEW OF SYSTEMS:    ALLERGIES No Known Allergies  PAST MEDICAL HISTORY Past Medical History:  Diagnosis Date  . Baker's cyst of knee    right knee   Past Surgical History:  Procedure Laterality Date  . colonoscopy with polypectomy    . DENTAL RESTORATION/EXTRACTION WITH X-RAY      FAMILY HISTORY Family History  Problem Relation Age of  Onset  . Cancer Maternal Aunt   . Arthritis Mother   . Colon cancer Neg Hx     SOCIAL HISTORY Social History   Tobacco Use  . Smoking status: Never Smoker  . Smokeless tobacco: Never Used  Substance Use Topics  . Alcohol use: No    Alcohol/week: 0.0 standard drinks    Comment: occasional beer  . Drug use: No         OPHTHALMIC EXAM:  Base Eye Exam    Visual Acuity (ETDRS)      Right Left   Dist [place] 20/60 -2 20/60ecc +1   Dist ph [place] 20/50 -1 20/50 -2       Tonometry (Tonopen, 3:38 PM)      Right Left   Pressure 16 18       Pupils      Pupils Dark Light Shape React APD   Right PERRL 4 3 Round Brisk None   Left PERRL 4 3 Round Brisk None       Visual Fields (Counting fingers)      Left Right    Full Full       Extraocular Movement      Right Left    Full Full       Neuro/Psych    Oriented x3: Yes  Mood/Affect: Normal       Dilation    Both eyes: 1.0% Mydriacyl, 2.5% Phenylephrine @ 3:38 PM        Slit Lamp and Fundus Exam    External Exam      Right Left   External Normal Normal       Slit Lamp Exam      Right Left   Lids/Lashes Normal Normal   Conjunctiva/Sclera White and quiet White and quiet   Cornea Clear Clear   Anterior Chamber Deep and quiet Deep and quiet   Iris Round and reactive Round and reactive   Lens 1+ Nuclear sclerosis 1+ Nuclear sclerosis   Anterior Vitreous Normal Normal       Fundus Exam      Right Left   Posterior Vitreous Normal Posterior vitreous detachment   Disc Normal Normal   C/D Ratio 0.5 0.5   Macula Soft drusen, Retinal pigment epithelial mottling Soft drusen   Vessels Normal Normal   Periphery Normal Normal          IMAGING AND PROCEDURES  Imaging and Procedures for 02/25/20  OCT, Retina - OU - Both Eyes       Right Eye Quality was good. Scan locations included subfoveal. Central Foveal Thickness: 291. Findings include abnormal foveal contour, subretinal hyper-reflective material.   Left  Eye Quality was good. Scan locations included subfoveal. Central Foveal Thickness: 280.   Notes Adult vitelliform macular dystrophy with subfoveal drusenoid material and serous detachment, OU with no interval change since November 2020. No signs of CNVM                ASSESSMENT/PLAN:  Dystrophies primarily involving the retinal pigment epithelium Adult vitelliform macular dystrophy, OU, no interval changes in the morphology of the foveal region in each eye. I did in the past look for signs of exacerbating features that affect macular perfusion and oxygenation. Patient has had sleep apnea checked for and is negative for this as well as review of systems for sleep apnea. Is no sign of vitreal macular adhesion to contribute to this feature.  Patient is to let us know promptly if new onset distortions were to occur      ICD-10-CM   1. Exudative age-related macular degeneration of left eye with inactive choroidal neovascularization (HCC)  H35.3222 OCT, Retina - OU - Both Eyes  2. Dystrophies primarily involving the retinal pigment epithelium  H35.54   3. Intermediate stage nonexudative age-related macular degeneration of both eyes  H35.3132     1. No interval change OU in 6 months.  2.  3.  Ophthalmic Meds Ordered this visit:  No orders of the defined types were placed in this encounter.      Return in about 6 months (around 08/27/2020) for DILATE OU, OCT.  There are no Patient Instructions on file for this visit.   Explained the diagnoses, plan, and follow up with the patient and they expressed understanding.  Patient expressed understanding of the importance of proper follow up care.   Clent Demark Ellen Mayol M.D. Diseases & Surgery of the Retina and Vitreous Retina & Diabetic [place] 02/25/20     Abbreviations: M myopia (nearsighted); A astigmatism; H hyperopia (farsighted); P presbyopia; Mrx spectacle prescription;  CTL contact lenses; OD right eye; OS left eye; OU  both eyes  XT exotropia; ET esotropia; PEK punctate epithelial keratitis; PEE punctate epithelial erosions; DES dry eye syndrome; MGD meibomian gland dysfunction; ATs artificial tears; PFAT's preservative free artificial  tears; [place] nuclear sclerotic cataract; PSC posterior subcapsular cataract; ERM epi-retinal membrane; PVD posterior vitreous detachment; RD retinal detachment; DM diabetes mellitus; DR diabetic retinopathy; NPDR non-proliferative diabetic retinopathy; PDR proliferative diabetic retinopathy; CSME clinically significant macular edema; DME diabetic macular edema; dbh dot blot hemorrhages; CWS cotton wool spot; POAG primary open angle glaucoma; C/D cup-to-disc ratio; HVF humphrey visual field; GVF goldmann visual field; OCT optical coherence tomography; IOP intraocular pressure; BRVO Branch retinal vein occlusion; CRVO central retinal vein occlusion; CRAO central retinal artery occlusion; BRAO branch retinal artery occlusion; RT retinal tear; SB scleral buckle; PPV pars plana vitrectomy; VH Vitreous hemorrhage; PRP panretinal laser photocoagulation; IVK intravitreal kenalog; VMT vitreomacular traction; MH Macular hole;  NVD neovascularization of the disc; NVE neovascularization elsewhere; AREDS age related eye disease study; ARMD age related macular degeneration; POAG primary open angle glaucoma; EBMD epithelial/anterior basement membrane dystrophy; ACIOL anterior chamber intraocular lens; IOL intraocular lens; PCIOL posterior chamber intraocular lens; Phaco/IOL phacoemulsification with intraocular lens placement; [place] photorefractive keratectomy; LASIK laser assisted in situ keratomileusis; HTN hypertension; DM diabetes mellitus; COPD chronic obstructive pulmonary disease

## 2020-02-25 NOTE — Assessment & Plan Note (Signed)
Adult vitelliform macular dystrophy, OU, no interval changes in the morphology of the foveal region in each eye. I did in the past look for signs of exacerbating features that affect macular perfusion and oxygenation. Patient has had sleep apnea checked for and is negative for this as well as review of systems for sleep apnea. Is no sign of vitreal macular adhesion to contribute to this feature.  Patient is to let us know promptly if new onset distortions were to occur

## 2020-03-30 ENCOUNTER — Other Ambulatory Visit: Payer: Self-pay | Admitting: Internal Medicine

## 2020-03-30 ENCOUNTER — Ambulatory Visit
Admission: RE | Admit: 2020-03-30 | Discharge: 2020-03-30 | Disposition: A | Discharge status: Home / Self Care | Payer: 59 | Source: Ambulatory Visit | Attending: Internal Medicine | Admitting: Internal Medicine

## 2020-03-30 DIAGNOSIS — R059 Cough, unspecified: Secondary | ICD-10-CM

## 2020-04-02 ENCOUNTER — Ambulatory Visit
Admission: RE | Admit: 2020-04-02 | Discharge: 2020-04-02 | Disposition: A | Discharge status: Home / Self Care | Payer: 59 | Source: Ambulatory Visit | Attending: Internal Medicine | Admitting: Internal Medicine

## 2020-04-02 ENCOUNTER — Other Ambulatory Visit: Payer: Self-pay | Admitting: Internal Medicine

## 2020-04-02 DIAGNOSIS — R9389 Abnormal findings on diagnostic imaging of other specified body structures: Secondary | ICD-10-CM

## 2020-08-27 ENCOUNTER — Other Ambulatory Visit: Payer: Self-pay

## 2020-08-27 ENCOUNTER — Encounter (INDEPENDENT_AMBULATORY_CARE_PROVIDER_SITE_OTHER): Payer: Self-pay | Admitting: Ophthalmology

## 2020-08-27 ENCOUNTER — Ambulatory Visit (INDEPENDENT_AMBULATORY_CARE_PROVIDER_SITE_OTHER): Payer: 59 | Admitting: Ophthalmology

## 2020-08-27 DIAGNOSIS — H353222 Exudative age-related macular degeneration, left eye, with inactive choroidal neovascularization: Secondary | ICD-10-CM

## 2020-08-27 DIAGNOSIS — H3554 Dystrophies primarily involving the retinal pigment epithelium: Secondary | ICD-10-CM

## 2020-08-27 NOTE — Assessment & Plan Note (Signed)
Patient does have a positive review of systems for sleep apnea yet has never had formal sleep testing

## 2020-08-27 NOTE — Progress Notes (Signed)
08/27/2020     CHIEF COMPLAINT Patient presents for Retina Follow Up   HISTORY OF PRESENT ILLNESS: Scott Sherman is a 64 y.o. male who presents to the clinic today for:   HPI    Retina Follow Up    Patient presents with  Wet AMD.  In left eye.  This started 6 months ago.  Severity is mild.  Duration of 6 months.  Since onset it is stable.          Comments    6 MO F/U OU    Pt reports stable vision, no F/F        Last edited by Nichola Sizer D on 08/27/2020  4:05 PM. (History)      Referring physician: Wenda Low, MD [address],  [place] [address]  HISTORICAL INFORMATION:   Selected notes from the [place]: No current outpatient medications on file. (Ophthalmic Drugs)   No current facility-administered medications for this visit. (Ophthalmic Drugs)   Current Outpatient Medications (Other)  Medication Sig  . cetirizine (ZYRTEC) 10 MG tablet Take 10 mg by mouth daily.  Marland Kitchen glucosamine-chondroitin 500-400 MG tablet Take 1 tablet by mouth daily. 1500MG   . multivitamin-lutein (OCUVITE-LUTEIN) CAPS capsule Take 1 capsule by mouth daily. AND ZEAXANTHIN  . Omega-3 Fatty Acids (FISH OIL) 1360 MG CAPS Take by mouth. OMEGA 3  . OVER THE COUNTER MEDICATION SUPER B COMPLEX 1 DAILY  . OVER THE COUNTER MEDICATION EQUATE DAILY FIBER 1 DAILY   Current Facility-Administered Medications (Other)  Medication Route  . 0.9 %  sodium chloride infusion Intravenous      REVIEW OF SYSTEMS:    ALLERGIES No Known Allergies  PAST MEDICAL HISTORY Past Medical History:  Diagnosis Date  . Baker's cyst of knee    right knee   Past Surgical History:  Procedure Laterality Date  . colonoscopy with polypectomy    . DENTAL RESTORATION/EXTRACTION WITH X-RAY      FAMILY HISTORY Family History  Problem Relation Age of Onset  . Cancer Maternal Aunt   . Arthritis Mother   . Colon cancer Neg Hx      SOCIAL HISTORY Social History   Tobacco Use  . Smoking status: Never Smoker  . Smokeless tobacco: Never Used  Substance Use Topics  . Alcohol use: No    Alcohol/week: 0.0 standard drinks    Comment: occasional beer  . Drug use: No         OPHTHALMIC EXAM:  Base Eye Exam    Visual Acuity (ETDRS)      Right Left   Dist [place] 20/70 +1 20/60 -1   Dist ph [place] 20/40 -1 20/50 -2       Tonometry (Tonopen, 4:15 PM)      Right Left   Pressure 13 14       Pupils      Pupils Dark Light Shape React APD   Right PERRL 4 3 Round Brisk None   Left PERRL 4 3 Round Brisk None       Visual Fields (Counting fingers)      Left Right    Full Full       Extraocular Movement      Right Left    Full Full       Neuro/Psych    Oriented x3: Yes   Mood/Affect: Normal       Dilation  Both eyes: 1.0% Mydriacyl, 2.5% Phenylephrine @ 4:15 PM        Slit Lamp and Fundus Exam    External Exam      Right Left   External Normal Normal       Slit Lamp Exam      Right Left   Lids/Lashes Normal Normal   Conjunctiva/Sclera White and quiet White and quiet   Cornea Clear Clear   Anterior Chamber Deep and quiet Deep and quiet   Iris Round and reactive Round and reactive   Lens 1+ Nuclear sclerosis 1+ Nuclear sclerosis   Anterior Vitreous Normal Normal       Fundus Exam      Right Left   Posterior Vitreous Normal Posterior vitreous detachment   Disc Normal Normal   C/D Ratio 0.5 0.5   Macula Soft drusen, Retinal pigment epithelial mottling Soft drusen   Vessels Normal Normal   Periphery Normal Normal          IMAGING AND PROCEDURES  Imaging and Procedures for 08/27/20  OCT, Retina - OU - Both Eyes       Right Eye Quality was good. Scan locations included subfoveal. Central Foveal Thickness: 291. Progression has been stable. Findings include abnormal foveal contour, subretinal hyper-reflective material, subretinal scarring.   Left Eye Quality was good. Scan  locations included subfoveal. Central Foveal Thickness: 281. Progression has been stable. Findings include abnormal foveal contour, subretinal hyper-reflective material, subretinal scarring.   Notes No signs of active CNVM with this vitelliform dystrophic type change                ASSESSMENT/PLAN:  Dystrophies primarily involving the retinal pigment epithelium Patient does have a positive review of systems for sleep apnea yet has never had formal sleep testing      ICD-10-CM   1. Exudative age-related macular degeneration of left eye with inactive choroidal neovascularization (HCC)  H35.3222 OCT, Retina - OU - Both Eyes  2. Dystrophies primarily involving the retinal pigment epithelium  H35.54     1.  2.  3.  Ophthalmic Meds Ordered this visit:  No orders of the defined types were placed in this encounter.      Return in about 6 months (around 02/24/2021) for DILATE OU, OCT.  There are no Patient Instructions on file for this visit.   Explained the diagnoses, plan, and follow up with the patient and they expressed understanding.  Patient expressed understanding of the importance of proper follow up care.   Clent Demark Delrico Minehart M.D. Diseases & Surgery of the Retina and Vitreous Retina & Diabetic [place] 08/27/20     Abbreviations: M myopia (nearsighted); A astigmatism; H hyperopia (farsighted); P presbyopia; Mrx spectacle prescription;  CTL contact lenses; OD right eye; OS left eye; OU both eyes  XT exotropia; ET esotropia; PEK punctate epithelial keratitis; PEE punctate epithelial erosions; DES dry eye syndrome; MGD meibomian gland dysfunction; ATs artificial tears; PFAT's preservative free artificial tears; [place] nuclear sclerotic cataract; PSC posterior subcapsular cataract; ERM epi-retinal membrane; PVD posterior vitreous detachment; RD retinal detachment; DM diabetes mellitus; DR diabetic retinopathy; NPDR non-proliferative diabetic retinopathy; PDR proliferative  diabetic retinopathy; CSME clinically significant macular edema; DME diabetic macular edema; dbh dot blot hemorrhages; CWS cotton wool spot; POAG primary open angle glaucoma; C/D cup-to-disc ratio; HVF humphrey visual field; GVF goldmann visual field; OCT optical coherence tomography; IOP intraocular pressure; BRVO Branch retinal vein occlusion; CRVO central retinal vein occlusion; CRAO central retinal artery occlusion; BRAO branch  retinal artery occlusion; RT retinal tear; SB scleral buckle; PPV pars plana vitrectomy; VH Vitreous hemorrhage; PRP panretinal laser photocoagulation; IVK intravitreal kenalog; VMT vitreomacular traction; MH Macular hole;  NVD neovascularization of the disc; NVE neovascularization elsewhere; AREDS age related eye disease study; ARMD age related macular degeneration; POAG primary open angle glaucoma; EBMD epithelial/anterior basement membrane dystrophy; ACIOL anterior chamber intraocular lens; IOL intraocular lens; PCIOL posterior chamber intraocular lens; Phaco/IOL phacoemulsification with intraocular lens placement; [place] photorefractive keratectomy; LASIK laser assisted in situ keratomileusis; HTN hypertension; DM diabetes mellitus; COPD chronic obstructive pulmonary disease

## 2020-10-29 ENCOUNTER — Encounter (INDEPENDENT_AMBULATORY_CARE_PROVIDER_SITE_OTHER): Payer: Self-pay

## 2020-11-03 ENCOUNTER — Other Ambulatory Visit (HOSPITAL_BASED_OUTPATIENT_CLINIC_OR_DEPARTMENT_OTHER): Payer: Self-pay

## 2020-11-03 DIAGNOSIS — G4710 Hypersomnia, unspecified: Secondary | ICD-10-CM

## 2020-11-03 DIAGNOSIS — G2581 Restless legs syndrome: Secondary | ICD-10-CM

## 2020-11-03 DIAGNOSIS — R0681 Apnea, not elsewhere classified: Secondary | ICD-10-CM

## 2020-11-03 DIAGNOSIS — R0683 Snoring: Secondary | ICD-10-CM

## 2020-12-08 ENCOUNTER — Other Ambulatory Visit: Payer: Self-pay

## 2020-12-08 ENCOUNTER — Ambulatory Visit (HOSPITAL_BASED_OUTPATIENT_CLINIC_OR_DEPARTMENT_OTHER): Payer: Medicare Other | Attending: Internal Medicine | Admitting: Internal Medicine

## 2020-12-08 DIAGNOSIS — R0681 Apnea, not elsewhere classified: Secondary | ICD-10-CM

## 2020-12-08 DIAGNOSIS — G4733 Obstructive sleep apnea (adult) (pediatric): Secondary | ICD-10-CM | POA: Insufficient documentation

## 2020-12-08 DIAGNOSIS — G4730 Sleep apnea, unspecified: Secondary | ICD-10-CM | POA: Diagnosis present

## 2020-12-08 DIAGNOSIS — R0683 Snoring: Secondary | ICD-10-CM | POA: Diagnosis not present

## 2020-12-08 DIAGNOSIS — G2581 Restless legs syndrome: Secondary | ICD-10-CM

## 2020-12-08 DIAGNOSIS — G4710 Hypersomnia, unspecified: Secondary | ICD-10-CM | POA: Insufficient documentation

## 2020-12-10 IMAGING — DX DG CHEST 2V
2 series · 2 of 2 positions shown · non-contrast
Comparison: 02/24/2020.

CLINICAL DATA: Cough.  Abnormal prior chest x-ray.

EXAM:
CHEST - 2 VIEW

[dg chest 2 view (1 of 2)]
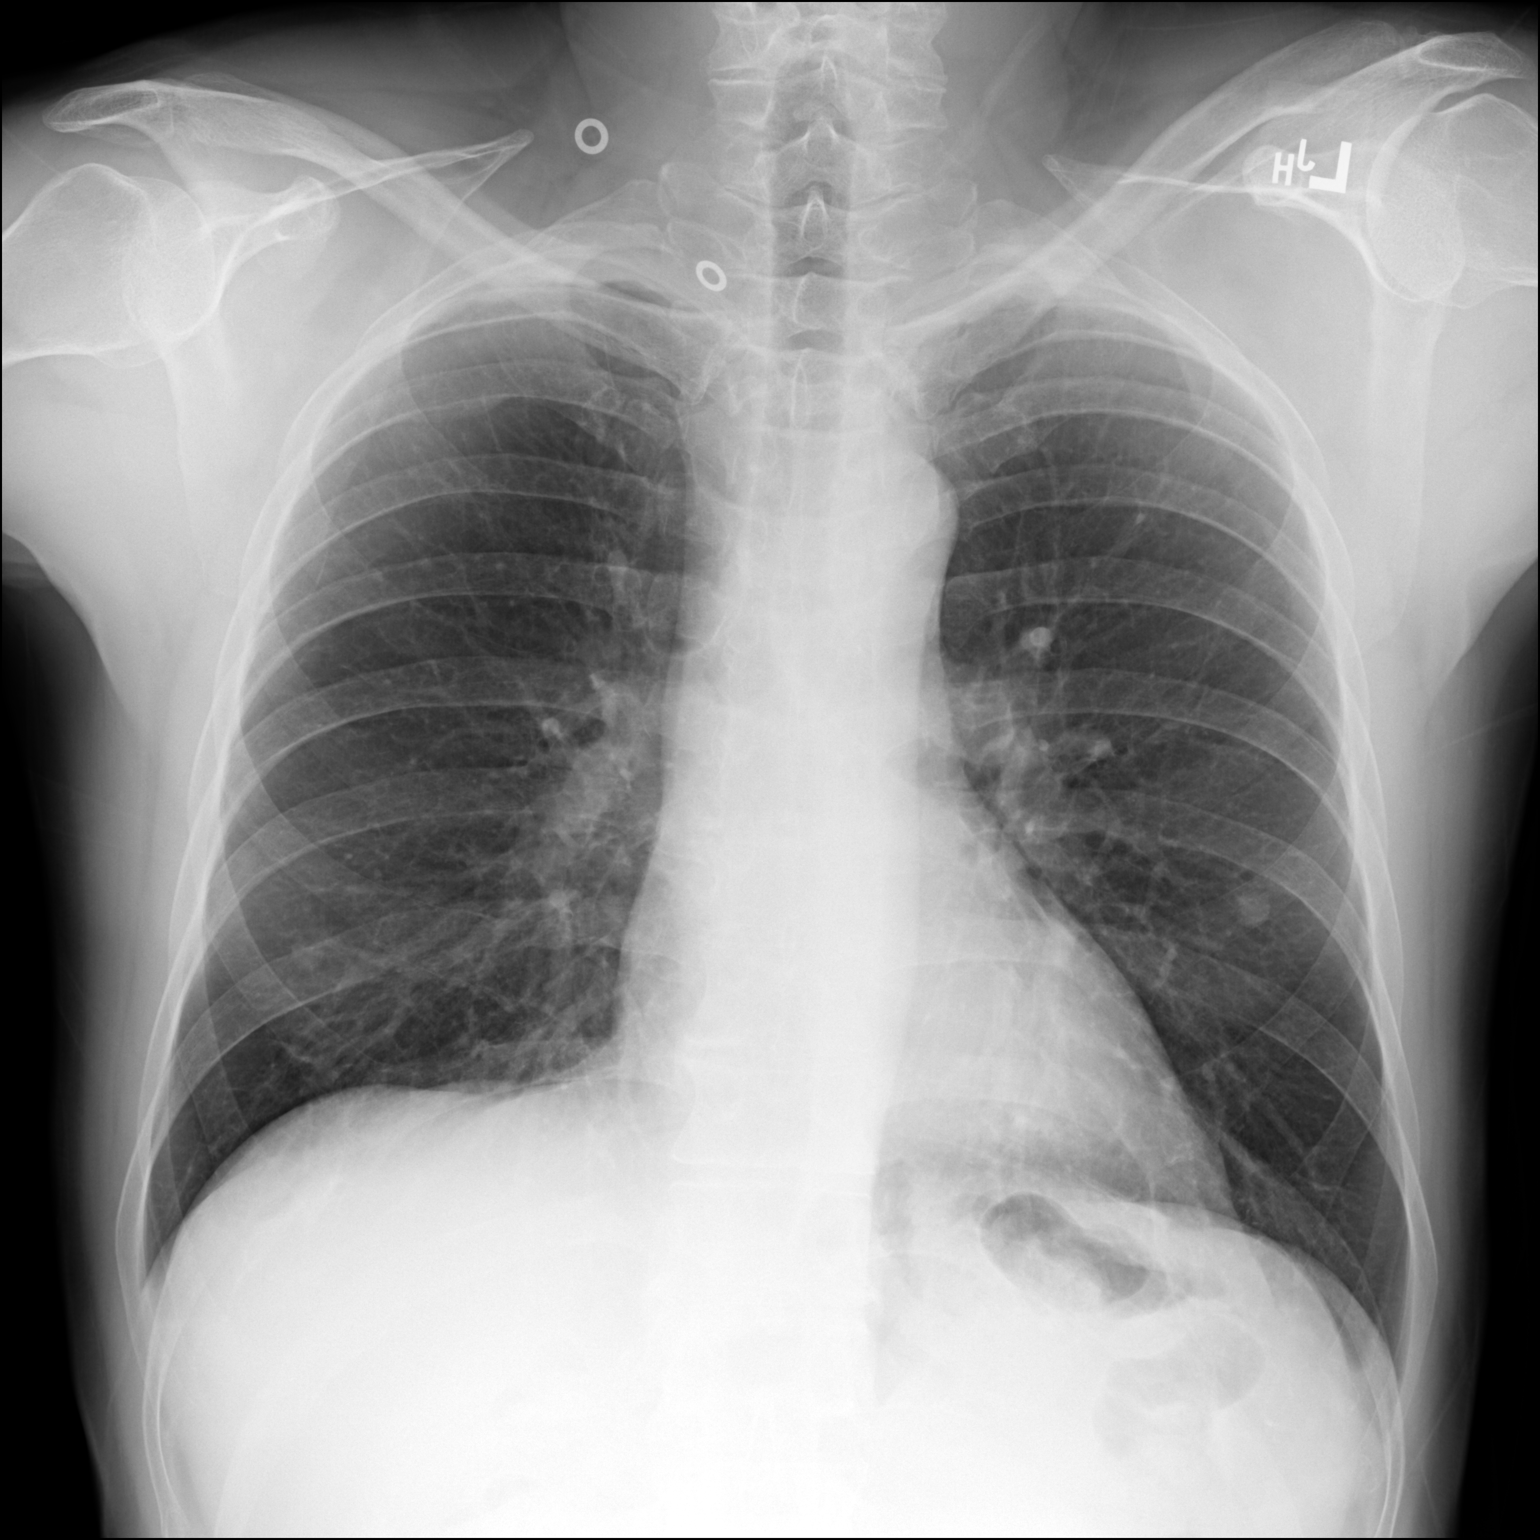

[dg chest 2 view (2 of 2)]
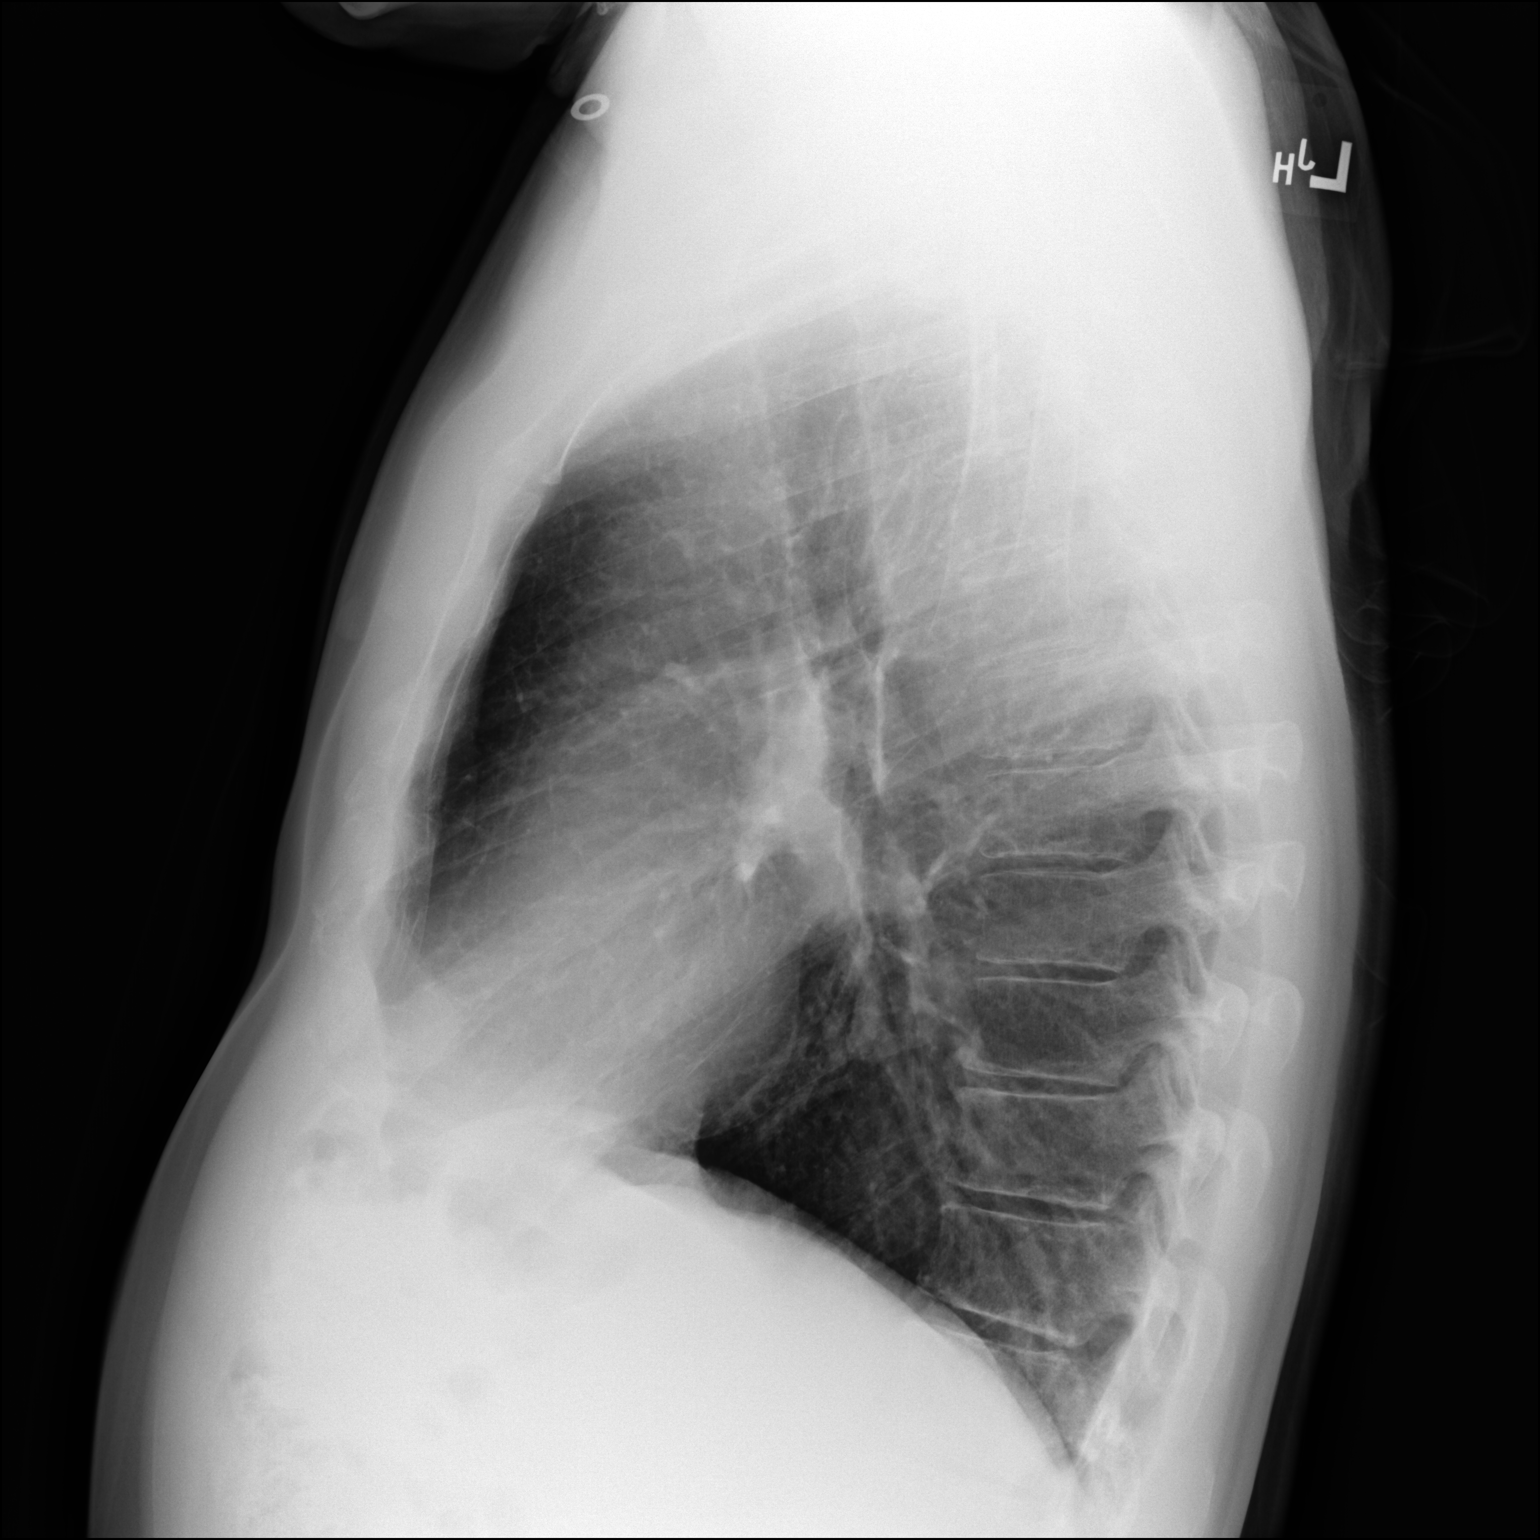

[2 of 2 positions shown; findings below may reference images not displayed]

FINDINGS: Mediastinum and hilar structures normal. Single solitary nodular
opacities noted over both lung bases on PA view only. These are most
likely nipple shadows. Repeat PA and lateral chest x-ray with nipple
markers suggested. Previously identified areas of bibasilar
atelectasis/infiltrates in the lung bases have cleared. No pleural
effusion or pneumothorax. Degenerative change thoracic spine.
IMPRESSION: 1. Single solitary nodular opacities noted over both lung bases on
PA view only. These are most likely nipple shadows. Repeat PA and
lateral chest x-ray with nipple markers suggested.

2. Previously identified areas of bibasilar atelectasis/infiltrates
have cleared.

## 2020-12-13 IMAGING — DX DG CHEST SPECIAL VIEW
1 series · 1 of 1 positions shown · non-contrast
Comparison: 03/30/2020

CLINICAL DATA: Abnormal chest x-ray.

EXAM:
CHEST SPECIAL VIEW

[dg chest special view]
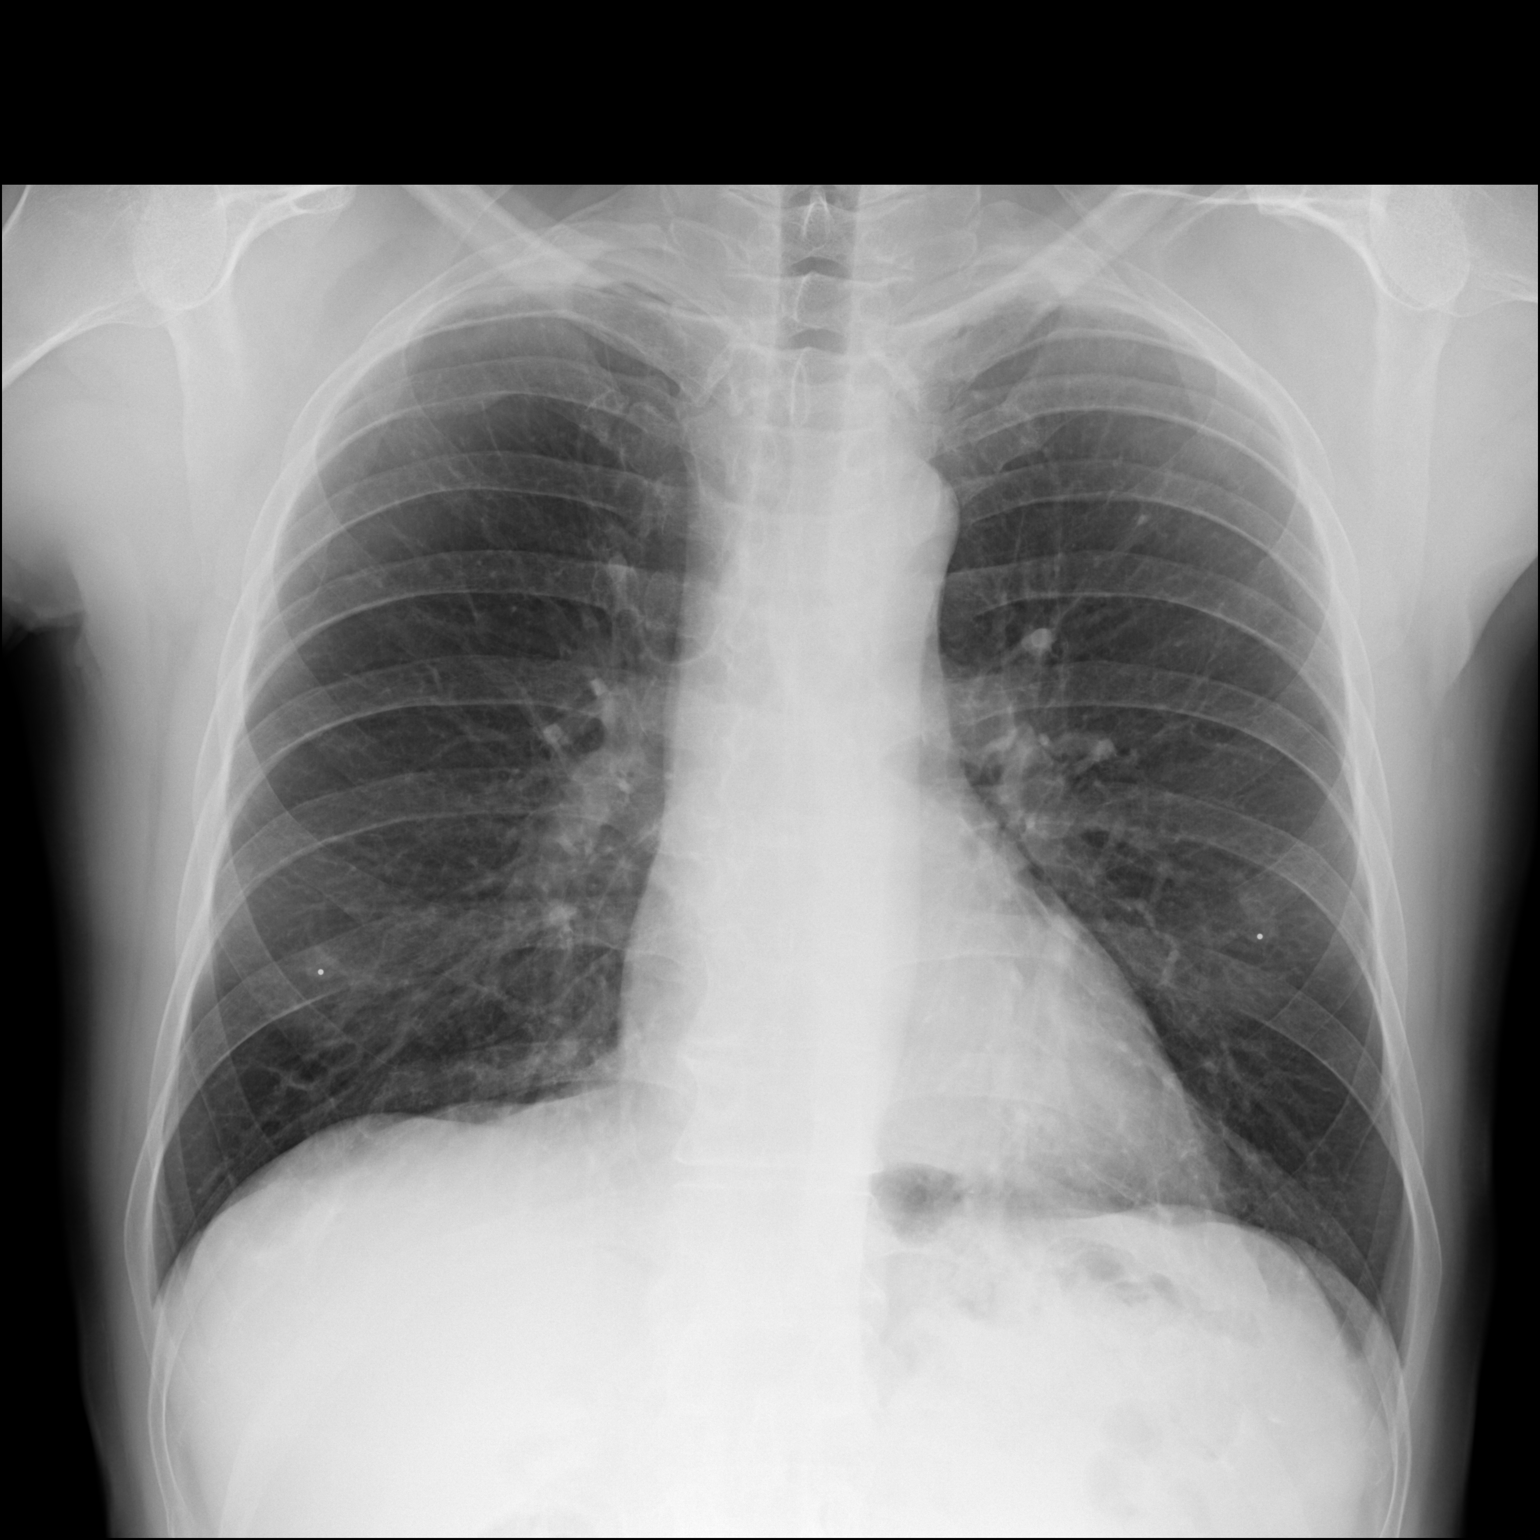

[1 of 1 positions shown; findings below may reference images not displayed]

FINDINGS: Nipple markers have been placed. The opacities overlying the mid
lung zones bilaterally represent nipple shadows. No evidence for
pulmonary mass. Heart size is normal and lungs are clear.
IMPRESSION: No pulmonary mass.

## 2020-12-13 NOTE — Procedures (Signed)
   NAME: Scott Sherman DATE OF BIRTH:  [birth year] MEDICAL RECORD NUMBER [id number]  LOCATION: [facility]  PHYSICIAN: Marius Ditch  DATE OF STUDY: 12/08/2020  SLEEP STUDY TYPE: Nocturnal Polysomnogram               REFERRING PHYSICIAN: Marius Ditch, MD  EPWORTH SLEEPINESS SCORE:  NA HEIGHT: 6\' 1"  (185.4 cm)  WEIGHT: 195 lb (88.5 kg)    Body mass index is 25.73 kg/m.  NECK SIZE: 16 in.  CLINICAL INFORMATION The patient was referred to the sleep center for evaluation of possible OSA. His HSAT showed borderline OSA and this test is done to further characterize his sleep disordered breathing.   MEDICATIONS No sleep medicine administered.Marland Kitchen  SLEEP STUDY TECHNIQUE A multi-channel overnight Polysomnography study was performed. The channels recorded and monitored were central and occipital EEG, electrooculogram (EOG), submentalis EMG (chin), nasal and oral airflow, thoracic and abdominal wall motion, anterior tibialis EMG, snore microphone, electrocardiogram, and a pulse oximetry.  TECHNICAL COMMENTS Comments added by Technician: Patient was restless all through the night. Comments added by Scorer: N/A  SLEEP ARCHITECTURE The study was initiated at 9:52:10 PM and terminated at 4:19:13 AM. The total recorded time was 387 minutes. EEG confirmed total sleep time was 212 minutes yielding a sleep efficiency of 54.8%%. Sleep onset after lights out was 6.9 minutes with a REM latency of 126.0 minutes. The patient spent 10.6%% of the night in stage N1 sleep, 75.9%% in stage N2 sleep, 0.0%% in stage N3 and 13.4% in REM. Wake after sleep onset (WASO) was 168.2 minutes. The Arousal Index was 23.8/hour.  RESPIRATORY PARAMETERS There were a total of 19 respiratory disturbances out of which 0 were apneas ( 0 obstructive, 0 mixed, 0 central) and 19 hypopneas. The apnea/hypopnea index (AHI) was 5.4 events/hour. The central sleep apnea index was 0 events/hour. The REM AHI was  33.7 events/hour and NREM AHI was 1.0 events/hour. The supine AHI was 14.1 events/hour and the non supine AHI was 0 events/hour. Respiratory Disturbance Index was 15.8 events/hour overall and 48.4 events/hour in REM. Respiratory disturbances were associated with oxygen desaturation down to a nadir of 86.0% during sleep. The mean oxygen saturation during the study was 93.5%. The cumulative time under 88% oxygen saturation was 1.6 minutes.  LEG MOVEMENT DATA The total leg movements were 48 with a resulting leg movement index of 13.6/hr . Associated arousal with leg movement index was 0.0/hr.  CARDIAC DATA The underlying cardiac rhythm was most consistent with sinus rhythm. Mean heart rate during sleep was 64.5 bpm. Additional rhythm abnormalities include PVCs.  IMPRESSIONS - Mild Obstructive Sleep apnea (OSA) by AHI; moderate OSA by RDI - Modest periodic leg movements(PLMs) during sleep. However, no significant associated arousals.  DIAGNOSIS - Obstructive Sleep Apnea (G47.33)  RECOMMENDATIONS - Mild obstructive sleep apnea. Return to discuss treatment options.  Marius Ditch Sleep specialist, [place] [employer]  ELECTRONICALLY SIGNED ON:  12/13/2020, 8:14 PM [place] PH: [phone]   FX: [phone] [place]

## 2021-02-25 ENCOUNTER — Encounter (INDEPENDENT_AMBULATORY_CARE_PROVIDER_SITE_OTHER): Payer: Medicare Other | Admitting: Ophthalmology

## 2021-03-11 ENCOUNTER — Encounter (INDEPENDENT_AMBULATORY_CARE_PROVIDER_SITE_OTHER): Payer: Medicare Other | Admitting: Ophthalmology

## 2021-03-23 ENCOUNTER — Encounter (INDEPENDENT_AMBULATORY_CARE_PROVIDER_SITE_OTHER): Payer: Self-pay | Admitting: Ophthalmology

## 2021-03-23 ENCOUNTER — Ambulatory Visit (INDEPENDENT_AMBULATORY_CARE_PROVIDER_SITE_OTHER): Payer: Medicare Other | Admitting: Ophthalmology

## 2021-03-23 ENCOUNTER — Other Ambulatory Visit: Payer: Self-pay

## 2021-03-23 DIAGNOSIS — H353222 Exudative age-related macular degeneration, left eye, with inactive choroidal neovascularization: Secondary | ICD-10-CM | POA: Diagnosis not present

## 2021-03-23 DIAGNOSIS — H3554 Dystrophies primarily involving the retinal pigment epithelium: Secondary | ICD-10-CM

## 2021-03-23 DIAGNOSIS — R0683 Snoring: Secondary | ICD-10-CM | POA: Diagnosis not present

## 2021-03-23 NOTE — Assessment & Plan Note (Signed)
Patient exploring mild therapies using oral EMA devices to try to prevent nightly hypoxia recently diagnosed mild sleep apnea.

## 2021-03-23 NOTE — Progress Notes (Signed)
03/23/2021     CHIEF COMPLAINT Patient presents for Retina Follow Up (6 Mo F/U OU//Pt c/o decreased near [place]. Pt sts, "some days are worse than others." Pt c/o difficulty reading small print, especially in certain lighting conditions.)   HISTORY OF PRESENT ILLNESS: Scott Sherman is a 65 y.o. male who presents to the clinic today for:   HPI     Retina Follow Up           Diagnosis: Wet AMD   Laterality: left eye   Onset: 6 months ago   Severity: mild   Duration: 6 months   Course: stable   Comments: 6 Mo F/U OU  Pt c/o decreased near [place]. Pt sts, "some days are worse than others." Pt c/o difficulty reading small print, especially in certain lighting conditions.       Last edited by Milly Jakob, Remy on 03/23/2021  3:20 PM.      Referring physician: Wenda Low, MD [address] [place],  [place] [address]  HISTORICAL INFORMATION:   Selected notes from the [facility]: No current outpatient medications on file. (Ophthalmic Drugs)   No current facility-administered medications for this visit. (Ophthalmic Drugs)   Current Outpatient Medications (Other)  Medication Sig   cetirizine (ZYRTEC) 10 MG tablet Take 10 mg by mouth daily.   glucosamine-chondroitin 500-400 MG tablet Take 1 tablet by mouth daily. 1500MG    multivitamin-lutein (OCUVITE-LUTEIN) CAPS capsule Take 1 capsule by mouth daily. AND ZEAXANTHIN   Omega-3 Fatty Acids (FISH OIL) 1360 MG CAPS Take by mouth. OMEGA 3   OVER THE COUNTER MEDICATION SUPER B COMPLEX 1 DAILY   OVER THE COUNTER MEDICATION EQUATE DAILY FIBER 1 DAILY   Current Facility-Administered Medications (Other)  Medication Route   0.9 %  sodium chloride infusion Intravenous      REVIEW OF SYSTEMS:    ALLERGIES No Known Allergies  PAST MEDICAL HISTORY Past Medical History:  Diagnosis Date   Baker's cyst of knee    right knee   Past Surgical History:  Procedure  Laterality Date   colonoscopy with polypectomy     DENTAL RESTORATION/EXTRACTION WITH X-RAY      FAMILY HISTORY Family History  Problem Relation Age of Onset   Cancer Maternal Aunt    Arthritis Mother    Colon cancer Neg Hx     SOCIAL HISTORY Social History   Tobacco Use   Smoking status: Never   Smokeless tobacco: Never  Substance Use Topics   Alcohol use: No    Alcohol/week: 0.0 standard drinks    Comment: occasional beer   Drug use: No         OPHTHALMIC EXAM:  Base Eye Exam     Visual Acuity (ETDRS)       Right Left   Dist [place] 20/60 -2 20/60 +1   Dist ph  20/40 20/50         Tonometry (Tonopen, 3:24 PM)       Right Left   Pressure 15 17         Pupils       Pupils Dark Light Shape React APD   Right PERRL 6 5 Round Brisk None   Left PERRL 6 5 Round Brisk None         Visual Fields (Counting fingers)       Left Right    Full Full  Extraocular Movement       Right Left    Full Full         Neuro/Psych     Oriented x3: Yes   Mood/Affect: Normal         Dilation     Both eyes: 1.0% Mydriacyl, 2.5% Phenylephrine @ 3:24 PM           Slit Lamp and Fundus Exam     External Exam       Right Left   External Normal Normal         Slit Lamp Exam       Right Left   Lids/Lashes Normal Normal   Conjunctiva/Sclera White and quiet White and quiet   Cornea Clear Clear   Anterior Chamber Deep and quiet Deep and quiet   Iris Round and reactive Round and reactive   Lens 1+ Nuclear sclerosis 1+ Nuclear sclerosis   Anterior Vitreous Normal Normal         Fundus Exam       Right Left   Posterior Vitreous Normal Posterior vitreous detachment   Disc Normal Normal   C/D Ratio 0.5 0.5   Macula Soft drusen, Retinal pigment epithelial mottling Soft drusen   Vessels Normal Normal   Periphery Normal Normal            IMAGING AND PROCEDURES  Imaging and Procedures for 03/23/21  OCT, Retina - OU - Both  Eyes       Right Eye Quality was good. Scan locations included subfoveal. Central Foveal Thickness: 292. Progression has been stable. Findings include abnormal foveal contour, subretinal hyper-reflective material, subretinal scarring.   Left Eye Quality was good. Scan locations included subfoveal. Central Foveal Thickness: 274. Progression has been stable. Findings include abnormal foveal contour, subretinal hyper-reflective material, subretinal scarring.   Notes No signs of active CNVM with this vitelliform dystrophic type change, may be slightly improved with less fluid at 1 month post wearing some type of oral device to minimize snoring or at least mandibular occlusion of the upper airway             ASSESSMENT/PLAN:  Snoring I do suggest consideration of attempted use of oral EMA device because of the patient's reluctance to use CPAP  All this is an attempt to prevent nightly hypoxic damage to the macula  Dystrophies primarily involving the retinal pigment epithelium Patient exploring mild therapies using oral EMA devices to try to prevent nightly hypoxia recently diagnosed mild sleep apnea.     ICD-10-CM   1. Exudative age-related macular degeneration of left eye with inactive choroidal neovascularization (HCC)  H35.3222 OCT, Retina - OU - Both Eyes    2. Snoring  R06.83     3. Dystrophies primarily involving the retinal pigment epithelium  H35.54       1.  2.  3.  Ophthalmic Meds Ordered this visit:  No orders of the defined types were placed in this encounter.      Return in about 3 months (around 06/23/2021) for OCT, NO DILATE.  There are no Patient Instructions on file for this visit.   Explained the diagnoses, plan, and follow up with the patient and they expressed understanding.  Patient expressed understanding of the importance of proper follow up care.   Clent Demark Matelyn Antonelli M.D. Diseases & Surgery of the Retina and Vitreous Retina & Diabetic [place] 03/23/21     Abbreviations: M myopia (nearsighted); A astigmatism; H hyperopia (farsighted); P presbyopia;  Mrx spectacle prescription;  CTL contact lenses; OD right eye; OS left eye; OU both eyes  XT exotropia; ET esotropia; PEK punctate epithelial keratitis; PEE punctate epithelial erosions; DES dry eye syndrome; MGD meibomian gland dysfunction; ATs artificial tears; PFAT's preservative free artificial tears; [place] nuclear sclerotic cataract; PSC posterior subcapsular cataract; ERM epi-retinal membrane; PVD posterior vitreous detachment; RD retinal detachment; DM diabetes mellitus; DR diabetic retinopathy; NPDR non-proliferative diabetic retinopathy; PDR proliferative diabetic retinopathy; CSME clinically significant macular edema; DME diabetic macular edema; dbh dot blot hemorrhages; CWS cotton wool spot; POAG primary open angle glaucoma; C/D cup-to-disc ratio; HVF humphrey visual field; GVF goldmann visual field; OCT optical coherence tomography; IOP intraocular pressure; BRVO Branch retinal vein occlusion; CRVO central retinal vein occlusion; CRAO central retinal artery occlusion; BRAO branch retinal artery occlusion; RT retinal tear; SB scleral buckle; PPV pars plana vitrectomy; VH Vitreous hemorrhage; PRP panretinal laser photocoagulation; IVK intravitreal kenalog; VMT vitreomacular traction; MH Macular hole;  NVD neovascularization of the disc; NVE neovascularization elsewhere; AREDS age related eye disease study; ARMD age related macular degeneration; POAG primary open angle glaucoma; EBMD epithelial/anterior basement membrane dystrophy; ACIOL anterior chamber intraocular lens; IOL intraocular lens; PCIOL posterior chamber intraocular lens; Phaco/IOL phacoemulsification with intraocular lens placement; [place] photorefractive keratectomy; LASIK laser assisted in situ keratomileusis; HTN hypertension; DM diabetes mellitus; COPD chronic obstructive pulmonary disease

## 2021-03-23 NOTE — Assessment & Plan Note (Signed)
I do suggest consideration of attempted use of oral EMA device because of the patient's reluctance to use CPAP  All this is an attempt to prevent nightly hypoxic damage to the macula

## 2021-06-29 ENCOUNTER — Encounter (INDEPENDENT_AMBULATORY_CARE_PROVIDER_SITE_OTHER): Payer: Medicare Other | Admitting: Ophthalmology

## 2021-06-30 ENCOUNTER — Ambulatory Visit (INDEPENDENT_AMBULATORY_CARE_PROVIDER_SITE_OTHER): Payer: Medicare Other | Admitting: Ophthalmology

## 2021-06-30 ENCOUNTER — Encounter (INDEPENDENT_AMBULATORY_CARE_PROVIDER_SITE_OTHER): Payer: Self-pay | Admitting: Ophthalmology

## 2021-06-30 ENCOUNTER — Other Ambulatory Visit: Payer: Self-pay

## 2021-06-30 DIAGNOSIS — H3554 Dystrophies primarily involving the retinal pigment epithelium: Secondary | ICD-10-CM

## 2021-06-30 DIAGNOSIS — H353222 Exudative age-related macular degeneration, left eye, with inactive choroidal neovascularization: Secondary | ICD-10-CM | POA: Diagnosis not present

## 2021-06-30 NOTE — Progress Notes (Signed)
06/30/2021     CHIEF COMPLAINT Patient presents for  Chief Complaint  Patient presents with   Retina Follow Up      HISTORY OF PRESENT ILLNESS: Scott Sherman is a 65 y.o. male who presents to the clinic today for:   HPI     Retina Follow Up   Patient presents with  Wet AMD.  In both eyes.  This started 3 months ago.  Duration of 3 months.        Comments   3 month f/u OU with OCT, no dilation  Pt denies any visual changes since previous visit. Pt denies any new flashes or floaters. Pt denies any eye pain.  Patient has had 3 sleep studies since his last visit here.  Reportedly the sleep studies did find some evidence of mild sleep apnea.  He had reluctance in the past to use CPAP thus has been using oral EMA type of device to bring the jaw forward and sleeping at night, and had repeat sleep study with the device to determine the best fit.  Last week sleep study showed normal oxygenation throughout the night.  Eye Meds: None      Last edited by Hurman Horn, MD on 06/30/2021  3:18 PM.      Referring physician: Wenda Low, MD [address] [place],  [place] [id number]  HISTORICAL INFORMATION:   Selected notes from the [facility]: No current outpatient medications on file. (Ophthalmic Drugs)   No current facility-administered medications for this visit. (Ophthalmic Drugs)   Current Outpatient Medications (Other)  Medication Sig   cetirizine (ZYRTEC) 10 MG tablet Take 10 mg by mouth daily.   glucosamine-chondroitin 500-400 MG tablet Take 1 tablet by mouth daily. 1500MG    multivitamin-lutein (OCUVITE-LUTEIN) CAPS capsule Take 1 capsule by mouth daily. AND ZEAXANTHIN   Omega-3 Fatty Acids (FISH OIL) 1360 MG CAPS Take by mouth. OMEGA 3   OVER THE COUNTER MEDICATION SUPER B COMPLEX 1 DAILY   OVER THE COUNTER MEDICATION EQUATE DAILY FIBER 1 DAILY   Current Facility-Administered Medications (Other)   Medication Route   0.9 %  sodium chloride infusion Intravenous      REVIEW OF SYSTEMS:    ALLERGIES No Known Allergies  PAST MEDICAL HISTORY Past Medical History:  Diagnosis Date   Baker's cyst of knee    right knee   Past Surgical History:  Procedure Laterality Date   colonoscopy with polypectomy     DENTAL RESTORATION/EXTRACTION WITH X-RAY      FAMILY HISTORY Family History  Problem Relation Age of Onset   Cancer Maternal Aunt    Arthritis Mother    Colon cancer Neg Hx     SOCIAL HISTORY Social History   Tobacco Use   Smoking status: Never   Smokeless tobacco: Never  Substance Use Topics   Alcohol use: No    Alcohol/week: 0.0 standard drinks    Comment: occasional beer   Drug use: No         OPHTHALMIC EXAM:  Base Eye Exam     Visual Acuity (ETDRS)       Right Left   Dist cc 20/50 -2 20/50 -2   Dist ph cc NI NI    Correction: Glasses         Tonometry (Tonopen, 3:10 PM)       Right Left   Pressure 18 16  Pupils       Pupils Dark Light Shape React APD   Right PERRL 6 5 Round Brisk None   Left PERRL 6 5 Round Brisk None         Visual Fields (Counting fingers)       Left Right    Full Full         Extraocular Movement       Right Left    Full, Ortho Full, Ortho         Neuro/Psych     Oriented x3: Yes   Mood/Affect: Normal         Dilation      NO DILATION            Slit Lamp and Fundus Exam     External Exam       Right Left   External Normal Normal         Slit Lamp Exam       Right Left   Lids/Lashes Normal Normal   Conjunctiva/Sclera White and quiet White and quiet   Cornea Clear Clear   Anterior Chamber Deep and quiet Deep and quiet   Iris Round and reactive Round and reactive   Lens 1+ Nuclear sclerosis 1+ Nuclear sclerosis   Anterior Vitreous Normal Normal            IMAGING AND PROCEDURES  Imaging and Procedures for 06/30/21  OCT, Retina - OU - Both Eyes        Right Eye Quality was good. Scan locations included subfoveal. Central Foveal Thickness: 293. Progression has been stable. Findings include abnormal foveal contour, subretinal hyper-reflective material, subretinal scarring.   Left Eye Quality was good. Scan locations included subfoveal. Central Foveal Thickness: 276. Progression has been stable. Findings include abnormal foveal contour, subretinal hyper-reflective material, subretinal scarring.   Notes No signs of active CNVM with this vitelliform dystrophic type change, now with at least 1 month of oral EMA device which has confirmatory sleep study that there is excellent oxygenation during the night ,continue to observe             ASSESSMENT/PLAN:  Dystrophies primarily involving the retinal pigment epithelium OU with serous retinal detachment which is remained stable now for many months, and recently patient has confirmatory of sleep apnea.  And yet with oral EMA device of the last 1 month or so patient does report sleeping somewhat better and also has "fewer snores on snore lab app" and confirmatory that recent sleep study with the oral EMA device that he maintain excellent oxygenation  We will follow follow-up with this and in 3 to 4 months again with an OCT to monitor for resolution or or signs of progression     ICD-10-CM   1. Exudative age-related macular degeneration of left eye with inactive choroidal neovascularization (HCC)  H35.3222 OCT, Retina - OU - Both Eyes    2. Dystrophies primarily involving the retinal pigment epithelium  H35.54       1.  Patient self-reports impressive change in snore lab scores and 45-50 episodes hourly to down to single digits.  "Very surprised at how severe my snoring was at night" now with oral EMA type of device patient has excellent oxygenation which was confirmed via sleep study with the device in place.  2.  I explained the patient that prevention of nightly hypoxic stress to the  macula may slow down further degeneration present in the fovea of each eye  3.  Ophthalmic Meds Ordered this visit:  No orders of the defined types were placed in this encounter.      Return in about 3 months (around 09/29/2021) for NO DILATE, OCT.  There are no Patient Instructions on file for this visit.   Explained the diagnoses, plan, and follow up with the patient and they expressed understanding.  Patient expressed understanding of the importance of proper follow up care.   Clent Demark Aliana Kreischer M.D. Diseases & Surgery of the Retina and Vitreous Retina & Diabetic [facility] 06/30/21     Abbreviations: M myopia (nearsighted); A astigmatism; H hyperopia (farsighted); P presbyopia; Mrx spectacle prescription;  CTL contact lenses; OD right eye; OS left eye; OU both eyes  XT exotropia; ET esotropia; PEK punctate epithelial keratitis; PEE punctate epithelial erosions; DES dry eye syndrome; MGD meibomian gland dysfunction; ATs artificial tears; PFAT's preservative free artificial tears; [place] nuclear sclerotic cataract; PSC posterior subcapsular cataract; ERM epi-retinal membrane; PVD posterior vitreous detachment; RD retinal detachment; DM diabetes mellitus; DR diabetic retinopathy; NPDR non-proliferative diabetic retinopathy; PDR proliferative diabetic retinopathy; CSME clinically significant macular edema; DME diabetic macular edema; dbh dot blot hemorrhages; CWS cotton wool spot; POAG primary open angle glaucoma; C/D cup-to-disc ratio; HVF humphrey visual field; GVF goldmann visual field; OCT optical coherence tomography; IOP intraocular pressure; BRVO Branch retinal vein occlusion; CRVO central retinal vein occlusion; CRAO central retinal artery occlusion; BRAO branch retinal artery occlusion; RT retinal tear; SB scleral buckle; PPV pars plana vitrectomy; VH Vitreous hemorrhage; PRP panretinal laser photocoagulation; IVK intravitreal kenalog; VMT vitreomacular traction; MH Macular hole;  NVD  neovascularization of the disc; NVE neovascularization elsewhere; AREDS age related eye disease study; ARMD age related macular degeneration; POAG primary open angle glaucoma; EBMD epithelial/anterior basement membrane dystrophy; ACIOL anterior chamber intraocular lens; IOL intraocular lens; PCIOL posterior chamber intraocular lens; Phaco/IOL phacoemulsification with intraocular lens placement; [place] photorefractive keratectomy; LASIK laser assisted in situ keratomileusis; HTN hypertension; DM diabetes mellitus; COPD chronic obstructive pulmonary disease

## 2021-06-30 NOTE — Assessment & Plan Note (Signed)
OU with serous retinal detachment which is remained stable now for many months, and recently patient has confirmatory of sleep apnea.  And yet with oral EMA device of the last 1 month or so patient does report sleeping somewhat better and also has "fewer snores on snore lab app" and confirmatory that recent sleep study with the oral EMA device that he maintain excellent oxygenation  We will follow follow-up with this and in 3 to 4 months again with an OCT to monitor for resolution or or signs of progression

## 2021-09-23 ENCOUNTER — Encounter (INDEPENDENT_AMBULATORY_CARE_PROVIDER_SITE_OTHER): Payer: Self-pay | Admitting: Ophthalmology

## 2021-09-23 ENCOUNTER — Ambulatory Visit (INDEPENDENT_AMBULATORY_CARE_PROVIDER_SITE_OTHER): Payer: Medicare Other | Admitting: Ophthalmology

## 2021-09-23 ENCOUNTER — Other Ambulatory Visit: Payer: Self-pay

## 2021-09-23 DIAGNOSIS — H353222 Exudative age-related macular degeneration, left eye, with inactive choroidal neovascularization: Secondary | ICD-10-CM | POA: Diagnosis not present

## 2021-09-23 DIAGNOSIS — H353132 Nonexudative age-related macular degeneration, bilateral, intermediate dry stage: Secondary | ICD-10-CM

## 2021-09-23 DIAGNOSIS — R0683 Snoring: Secondary | ICD-10-CM | POA: Diagnosis not present

## 2021-09-23 DIAGNOSIS — H3554 Dystrophies primarily involving the retinal pigment epithelium: Secondary | ICD-10-CM

## 2021-09-23 NOTE — Progress Notes (Signed)
09/23/2021     CHIEF COMPLAINT Patient presents for  Chief Complaint  Patient presents with   Retina Follow Up      HISTORY OF PRESENT ILLNESS: Scott Sherman is a 65 y.o. male who presents to the clinic today for:   HPI     Retina Follow Up           Diagnosis: Wet AMD   Laterality: left eye   Onset: 3 months ago   Severity: mild   Duration: 3 months   Course: stable         Comments   3 month fu ou and oct- no dilation  Pt states VA OU stable since last visit. Pt denies FOL, floaters, or ocular pain OU.  Pt reports that he is still using oral EMA device         Last edited by Kendra Opitz, COA on 09/23/2021  1:39 PM.      Referring physician: Wenda Low, MD [place],  [place] [address]  HISTORICAL INFORMATION:   Selected notes from the [facility]: No current outpatient medications on file. (Ophthalmic Drugs)   No current facility-administered medications for this visit. (Ophthalmic Drugs)   Current Outpatient Medications (Other)  Medication Sig   cetirizine (ZYRTEC) 10 MG tablet Take 10 mg by mouth daily.   glucosamine-chondroitin 500-400 MG tablet Take 1 tablet by mouth daily. 1500MG    multivitamin-lutein (OCUVITE-LUTEIN) CAPS capsule Take 1 capsule by mouth daily. AND ZEAXANTHIN   Omega-3 Fatty Acids (FISH OIL) 1360 MG CAPS Take by mouth. OMEGA 3   OVER THE COUNTER MEDICATION SUPER B COMPLEX 1 DAILY   OVER THE COUNTER MEDICATION EQUATE DAILY FIBER 1 DAILY   Current Facility-Administered Medications (Other)  Medication Route   0.9 %  sodium chloride infusion Intravenous      REVIEW OF SYSTEMS:    ALLERGIES No Known Allergies  PAST MEDICAL HISTORY Past Medical History:  Diagnosis Date   Baker's cyst of knee    right knee   Past Surgical History:  Procedure Laterality Date   colonoscopy with polypectomy     DENTAL RESTORATION/EXTRACTION WITH X-RAY       FAMILY HISTORY Family History  Problem Relation Age of Onset   Cancer Maternal Aunt    Arthritis Mother    Colon cancer Neg Hx     SOCIAL HISTORY Social History   Tobacco Use   Smoking status: Never   Smokeless tobacco: Never  Substance Use Topics   Alcohol use: No    Alcohol/week: 0.0 standard drinks    Comment: occasional beer   Drug use: No         OPHTHALMIC EXAM:  Base Eye Exam     Visual Acuity (ETDRS)       Right Left   Dist [place] 20/60 20/50   Dist ph [place] 20/40 +2 20/40 -1         Tonometry (Tonopen, 1:44 PM)       Right Left   Pressure 15 14         Pupils       Pupils Shape React APD   Right PERRL Round Brisk None   Left PERRL Round Brisk None         Visual Fields (Counting fingers)       Left Right    Full Full  Extraocular Movement       Right Left    Full Full         Neuro/Psych     Oriented x3: Yes   Mood/Affect: Normal         Dilation     Both eyes: No Dilation @ 1:39 PM           Slit Lamp and Fundus Exam     External Exam       Right Left   External Normal Normal         Slit Lamp Exam       Right Left   Lids/Lashes Normal Normal   Conjunctiva/Sclera White and quiet White and quiet   Cornea Clear Clear   Anterior Chamber Deep and quiet Deep and quiet   Iris Round and reactive Round and reactive   Lens 1+ Nuclear sclerosis 1+ Nuclear sclerosis   Anterior Vitreous Normal Normal            IMAGING AND PROCEDURES  Imaging and Procedures for 09/23/21  OCT, Retina - OU - Both Eyes       Right Eye Quality was good. Scan locations included subfoveal. Central Foveal Thickness: 291. Progression has been stable. Findings include abnormal foveal contour, subretinal hyper-reflective material, subretinal scarring.   Left Eye Quality was good. Scan locations included subfoveal. Central Foveal Thickness: 277. Progression has been stable. Findings include abnormal foveal contour,  subretinal hyper-reflective material, subretinal scarring.   Notes No signs of active CNVM with this vitelliform dystrophic type change, now with at least 6 month of oral EMA device which has confirmatory sleep study that there is excellent oxygenation during the night ,continue to observe             ASSESSMENT/PLAN:  Snoring Patient using oral EMA device now roughly for 6 months.  He does report improved sleeping through the night and less "snore scores" on his nightly snore app monitoring device at home, self monitoring  Dystrophies primarily involving the retinal pigment epithelium Bilateral subfoveal serous detachments , Overall stable over the last 6 months     ICD-10-CM   1. Exudative age-related macular degeneration of left eye with inactive choroidal neovascularization (HCC)  H35.3222 OCT, Retina - OU - Both Eyes    2. Dystrophies primarily involving the retinal pigment epithelium  H35.54     3. Intermediate stage nonexudative age-related macular degeneration of both eyes  H35.3132     4. Snoring  R06.83       1.  Bilateral adult vitelliform like dystrophies with macular serous retinal detachments subfoveal which have been unchanged now for long number of years, yet no sign of CNVM.  Patient has recently started oral EMA device to maximize nightly oxygenation in the treatment of mild "mild sleep apnea" by sleep study, yet he does report indirect measure of "snore app" much improved breathing at night when listening to the recording scores.  2.  I shared with the patient well new nighttime continuous reading oximetry to get an accurate assessment as whether or not his oral EMA device is providing above 90% O2 saturations for most of the night  3.  No specific retinal treatment otherwise possible or necessary at this time  Ophthalmic Meds Ordered this visit:  No orders of the defined types were placed in this encounter.      Return in about 6 months (around  03/24/2022) for DILATE OU, OCT.  There are no Patient Instructions on file for  this visit.   Explained the diagnoses, plan, and follow up with the patient and they expressed understanding.  Patient expressed understanding of the importance of proper follow up care.   Clent Demark Sirius Woodford M.D. Diseases & Surgery of the Retina and Vitreous Retina & Diabetic [place] 09/23/21     Abbreviations: M myopia (nearsighted); A astigmatism; H hyperopia (farsighted); P presbyopia; Mrx spectacle prescription;  CTL contact lenses; OD right eye; OS left eye; OU both eyes  XT exotropia; ET esotropia; PEK punctate epithelial keratitis; PEE punctate epithelial erosions; DES dry eye syndrome; MGD meibomian gland dysfunction; ATs artificial tears; PFAT's preservative free artificial tears; [place] nuclear sclerotic cataract; PSC posterior subcapsular cataract; ERM epi-retinal membrane; PVD posterior vitreous detachment; RD retinal detachment; DM diabetes mellitus; DR diabetic retinopathy; NPDR non-proliferative diabetic retinopathy; PDR proliferative diabetic retinopathy; CSME clinically significant macular edema; DME diabetic macular edema; dbh dot blot hemorrhages; CWS cotton wool spot; POAG primary open angle glaucoma; C/D cup-to-disc ratio; HVF humphrey visual field; GVF goldmann visual field; OCT optical coherence tomography; IOP intraocular pressure; BRVO Branch retinal vein occlusion; CRVO central retinal vein occlusion; CRAO central retinal artery occlusion; BRAO branch retinal artery occlusion; RT retinal tear; SB scleral buckle; PPV pars plana vitrectomy; VH Vitreous hemorrhage; PRP panretinal laser photocoagulation; IVK intravitreal kenalog; VMT vitreomacular traction; MH Macular hole;  NVD neovascularization of the disc; NVE neovascularization elsewhere; AREDS age related eye disease study; ARMD age related macular degeneration; POAG primary open angle glaucoma; EBMD epithelial/anterior basement membrane dystrophy;  ACIOL anterior chamber intraocular lens; IOL intraocular lens; PCIOL posterior chamber intraocular lens; Phaco/IOL phacoemulsification with intraocular lens placement; [place] photorefractive keratectomy; LASIK laser assisted in situ keratomileusis; HTN hypertension; DM diabetes mellitus; COPD chronic obstructive pulmonary disease

## 2021-09-23 NOTE — Assessment & Plan Note (Signed)
Bilateral subfoveal serous detachments , Overall stable over the last 6 months

## 2021-09-23 NOTE — Assessment & Plan Note (Signed)
Patient using oral EMA device now roughly for 6 months.  He does report improved sleeping through the night and less "snore scores" on his nightly snore app monitoring device at home, self monitoring

## 2021-09-30 ENCOUNTER — Encounter (INDEPENDENT_AMBULATORY_CARE_PROVIDER_SITE_OTHER): Payer: Medicare Other | Admitting: Ophthalmology

## 2022-03-24 ENCOUNTER — Encounter (INDEPENDENT_AMBULATORY_CARE_PROVIDER_SITE_OTHER): Payer: Self-pay | Admitting: Ophthalmology

## 2022-03-24 ENCOUNTER — Ambulatory Visit (INDEPENDENT_AMBULATORY_CARE_PROVIDER_SITE_OTHER): Payer: Medicare Other | Admitting: Ophthalmology

## 2022-03-24 DIAGNOSIS — H353222 Exudative age-related macular degeneration, left eye, with inactive choroidal neovascularization: Secondary | ICD-10-CM | POA: Diagnosis not present

## 2022-03-24 DIAGNOSIS — G4733 Obstructive sleep apnea (adult) (pediatric): Secondary | ICD-10-CM

## 2022-03-24 DIAGNOSIS — H3554 Dystrophies primarily involving the retinal pigment epithelium: Secondary | ICD-10-CM | POA: Diagnosis not present

## 2022-03-24 DIAGNOSIS — H43823 Vitreomacular adhesion, bilateral: Secondary | ICD-10-CM | POA: Insufficient documentation

## 2022-03-24 DIAGNOSIS — H2513 Age-related nuclear cataract, bilateral: Secondary | ICD-10-CM

## 2022-03-24 DIAGNOSIS — H353132 Nonexudative age-related macular degeneration, bilateral, intermediate dry stage: Secondary | ICD-10-CM

## 2022-03-24 NOTE — Progress Notes (Signed)
03/24/2022     CHIEF COMPLAINT Patient presents for  Chief Complaint  Patient presents with   Macular Degeneration      HISTORY OF PRESENT ILLNESS: Scott Sherman is a 66 y.o. male who presents to the clinic today for:   HPI   6 mos FU Dilate OU, OCT. Patient states vision is stable and unchanged since last visit. Denies any new floaters or FOL.  Last edited by Laurin Coder on 03/24/2022  1:34 PM.      Referring physician: Monna Fam, MD [place],  [place] [address]  HISTORICAL INFORMATION:   Selected notes from the MEDICAL RECORD NUMBER       CURRENT MEDICATIONS: No current outpatient medications on file. (Ophthalmic Drugs)   No current facility-administered medications for this visit. (Ophthalmic Drugs)   Current Outpatient Medications (Other)  Medication Sig   cetirizine (ZYRTEC) 10 MG tablet Take 10 mg by mouth daily.   glucosamine-chondroitin 500-400 MG tablet Take 1 tablet by mouth daily. '1500MG'$    multivitamin-lutein (OCUVITE-LUTEIN) CAPS capsule Take 1 capsule by mouth daily. AND ZEAXANTHIN   Omega-3 Fatty Acids (FISH OIL) 1360 MG CAPS Take by mouth. OMEGA 3   OVER THE COUNTER MEDICATION SUPER B COMPLEX 1 DAILY   OVER THE COUNTER MEDICATION EQUATE DAILY FIBER 1 DAILY   Current Facility-Administered Medications (Other)  Medication Route   0.9 %  sodium chloride infusion Intravenous      REVIEW OF SYSTEMS: ROS   Negative for: Constitutional, Gastrointestinal, Neurological, Skin, Genitourinary, Musculoskeletal, HENT, Endocrine, Cardiovascular, Eyes, Respiratory, Psychiatric, Allergic/Imm, Heme/Lymph Last edited by Hurman Horn, MD on 03/24/2022  2:15 PM.       ALLERGIES No Known Allergies  PAST MEDICAL HISTORY Past Medical History:  Diagnosis Date   Baker's cyst of knee    right knee   Past Surgical History:  Procedure Laterality Date   colonoscopy with polypectomy     DENTAL RESTORATION/EXTRACTION WITH  X-RAY      FAMILY HISTORY Family History  Problem Relation Age of Onset   Cancer Maternal Aunt    Arthritis Mother    Colon cancer Neg Hx     SOCIAL HISTORY Social History   Tobacco Use   Smoking status: Never   Smokeless tobacco: Never  Substance Use Topics   Alcohol use: No    Alcohol/week: 0.0 standard drinks of alcohol    Comment: occasional beer   Drug use: No         OPHTHALMIC EXAM:  Base Eye Exam     Visual Acuity (ETDRS)       Right Left   Dist [place] 20/60 -2 20/60 -1   Dist ph [place] 20/60 +2 20/50 -1         Tonometry (Tonopen, 1:39 PM)       Right Left   Pressure 16 17         Pupils       Pupils APD   Right PERRL None   Left PERRL None         Extraocular Movement       Right Left    Full Full         Neuro/Psych     Oriented x3: Yes   Mood/Affect: Normal         Dilation     Both eyes: 1.0% Mydriacyl, 2.5% Phenylephrine @ 1:39 PM           Slit Lamp and Fundus  Exam     External Exam       Right Left   External Normal Normal         Slit Lamp Exam       Right Left   Lids/Lashes Normal Normal   Conjunctiva/Sclera White and quiet White and quiet   Cornea Clear Clear   Anterior Chamber Deep and quiet Deep and quiet   Iris Round and reactive Round and reactive   Lens 1+ Nuclear sclerosis 1+ Nuclear sclerosis   Anterior Vitreous Normal Normal         Fundus Exam       Right Left   Posterior Vitreous Normal Posterior vitreous detachment   Disc Normal Normal   C/D Ratio 0.5 0.5   Macula Soft drusen, Retinal pigment epithelial mottling Soft drusen   Vessels Normal Normal   Periphery Normal Normal            IMAGING AND PROCEDURES  Imaging and Procedures for 03/24/22  OCT, Retina - OU - Both Eyes       Right Eye Central Foveal Thickness: 280. Progression has been stable. Findings include retinal drusen , subretinal fluid.   Left Eye Central Foveal Thickness: 278. Progression has been  stable. Findings include retinal drusen , subretinal fluid.   Notes OD, with smaller serous retinal detachment over the foveal drusenoid deposit centrally.  Compared to 3 years 2 years in 1 year previous graft OS with outer retinal disruption at the photoreceptor layer accounting for acuity overall stable however             ASSESSMENT/PLAN:  Dystrophies primarily involving the retinal pigment epithelium Adult vitelliform dystrophy type findings by OCT and by clinical findings however slightly improved less SRF.  This patient continues to wear an oral EMA device for documented mild sleep apnea  Sleep apnea OD mild by hospital sleep lab testing, treated with oral EMA type of device  Exudative age-related macular degeneration of left eye with inactive choroidal neovascularization (HCC) No sign of active CNVM  Intermediate stage nonexudative age-related macular degeneration of both eyes Stable OU  Nuclear sclerotic cataract of both eyes Follow-up Dr. Herbert Deaner as scheduled.  May consider cataract surgery in each eye to maximize visual potential long-term  Vitreomacular adhesion of both eyes Physiologic at this time no impact on acuity     ICD-10-CM   1. Exudative age-related macular degeneration of left eye with inactive choroidal neovascularization (HCC)  H35.3222 OCT, Retina - OU - Both Eyes    2. Dystrophies primarily involving the retinal pigment epithelium  H35.54     3. Obstructive sleep apnea syndrome  G47.33     4. Intermediate stage nonexudative age-related macular degeneration of both eyes  H35.3132     5. Nuclear sclerotic cataract of both eyes  H25.13     6. Vitreomacular adhesion of both eyes  H43.823       1.Over the last 2 years.  Stable OU, chronic subfoveal serous type retinal detachments in left eye on no therapy for wet AMD thus this is a type of vitelliform dystrophy of the adult onset but personally I think it is also related to prior untreated mild sleep  apnea which is treated with oral EMA type of device to sleep with in the small serous retinal detachment particular the right eye he is slowly improving.  2.  No specific antivegF warranted at this time.  3.  Might consider cataract extraction with intraocular lens placement in each  eye to maximize visual potential  Ophthalmic Meds Ordered this visit:  No orders of the defined types were placed in this encounter.      Return in about 6 months (around 09/23/2022) for DILATE OU, COLOR FP, OCT.  There are no Patient Instructions on file for this visit.   Explained the diagnoses, plan, and follow up with the patient and they expressed understanding.  Patient expressed understanding of the importance of proper follow up care.   Clent Demark Lakiesha Ralphs M.D. Diseases & Surgery of the Retina and Vitreous Retina & Diabetic [place] 03/24/22     Abbreviations: M myopia (nearsighted); A astigmatism; H hyperopia (farsighted); P presbyopia; Mrx spectacle prescription;  CTL contact lenses; OD right eye; OS left eye; OU both eyes  XT exotropia; ET esotropia; PEK punctate epithelial keratitis; PEE punctate epithelial erosions; DES dry eye syndrome; MGD meibomian gland dysfunction; ATs artificial tears; PFAT's preservative free artificial tears; [place] nuclear sclerotic cataract; PSC posterior subcapsular cataract; ERM epi-retinal membrane; PVD posterior vitreous detachment; RD retinal detachment; DM diabetes mellitus; DR diabetic retinopathy; NPDR non-proliferative diabetic retinopathy; PDR proliferative diabetic retinopathy; CSME clinically significant macular edema; DME diabetic macular edema; dbh dot blot hemorrhages; CWS cotton wool spot; POAG primary open angle glaucoma; C/D cup-to-disc ratio; HVF humphrey visual field; GVF goldmann visual field; OCT optical coherence tomography; IOP intraocular pressure; BRVO Branch retinal vein occlusion; CRVO central retinal vein occlusion; CRAO central retinal artery  occlusion; BRAO branch retinal artery occlusion; RT retinal tear; SB scleral buckle; PPV pars plana vitrectomy; VH Vitreous hemorrhage; PRP panretinal laser photocoagulation; IVK intravitreal kenalog; VMT vitreomacular traction; MH Macular hole;  NVD neovascularization of the disc; NVE neovascularization elsewhere; AREDS age related eye disease study; ARMD age related macular degeneration; POAG primary open angle glaucoma; EBMD epithelial/anterior basement membrane dystrophy; ACIOL anterior chamber intraocular lens; IOL intraocular lens; PCIOL posterior chamber intraocular lens; Phaco/IOL phacoemulsification with intraocular lens placement; [place] photorefractive keratectomy; LASIK laser assisted in situ keratomileusis; HTN hypertension; DM diabetes mellitus; COPD chronic obstructive pulmonary disease

## 2022-03-24 NOTE — Assessment & Plan Note (Signed)
Follow-up Dr. Herbert Deaner as scheduled.  May consider cataract surgery in each eye to maximize visual potential long-term

## 2022-03-24 NOTE — Assessment & Plan Note (Signed)
Physiologic at this time no impact on acuity 

## 2022-03-24 NOTE — Assessment & Plan Note (Signed)
No sign of active CNVM 

## 2022-03-24 NOTE — Assessment & Plan Note (Signed)
Stable OU 

## 2022-03-24 NOTE — Assessment & Plan Note (Signed)
Adult vitelliform dystrophy type findings by OCT and by clinical findings however slightly improved less SRF.  This patient continues to wear an oral EMA device for documented mild sleep apnea

## 2022-03-24 NOTE — Assessment & Plan Note (Signed)
OD mild by hospital sleep lab testing, treated with oral EMA type of device

## 2022-09-27 ENCOUNTER — Encounter (INDEPENDENT_AMBULATORY_CARE_PROVIDER_SITE_OTHER): Payer: Self-pay

## 2022-09-27 ENCOUNTER — Encounter (INDEPENDENT_AMBULATORY_CARE_PROVIDER_SITE_OTHER): Payer: Medicare Other | Admitting: Ophthalmology

## 2023-09-14 ENCOUNTER — Ambulatory Visit (INDEPENDENT_AMBULATORY_CARE_PROVIDER_SITE_OTHER): Payer: Medicare Other

## 2023-09-14 ENCOUNTER — Ambulatory Visit: Payer: Medicare Other | Admitting: Podiatry

## 2023-09-14 ENCOUNTER — Encounter: Payer: Self-pay | Admitting: Podiatry

## 2023-09-14 DIAGNOSIS — D2372 Other benign neoplasm of skin of left lower limb, including hip: Secondary | ICD-10-CM

## 2023-09-14 DIAGNOSIS — M898X7 Other specified disorders of bone, ankle and foot: Secondary | ICD-10-CM

## 2023-09-17 NOTE — Progress Notes (Signed)
  Subjective:  Patient ID: Scott Sherman, male    DOB: [birth year],  MRN: [id number] HPI Chief Complaint  Patient presents with   Toe Pain    4th/5th toes left - corn medial 5th and lateral 4th x months, tender, been on antibiotics by PCP, worse with activity, tried soaking, wears bandaid to cushion   New Patient (Initial Visit)    67 y.o. male presents with the above complaint.   ROS: Denies fever chills nausea vomit muscle aches pains calf pain back pain chest pain shortness of breath.  Past Medical History:  Diagnosis Date   Baker's cyst of knee    right knee   Past Surgical History:  Procedure Laterality Date   colonoscopy with polypectomy     DENTAL RESTORATION/EXTRACTION WITH X-RAY     No current outpatient medications on file.  No Known Allergies Review of Systems Objective:  There were no vitals filed for this visit.  General: Well developed, nourished, in no acute distress, alert and oriented x3   Dermatological: Skin is warm, dry and supple bilateral. Nails x 10 are well maintained; remaining integument appears unremarkable at this time. There are no open sores, no preulcerative lesions, no rash or signs of infection present.  Reactive hyperkeratotic benign skin lesion medial aspect fifth digit left foot overlying the distal phalanx this is secondary to juxtaposition to the fourth toe.  Vascular: Dorsalis Pedis artery and Posterior Tibial artery pedal pulses are 2/4 bilateral with immedate capillary fill time. Pedal hair growth present. No varicosities and no lower extremity edema present bilateral.   Neruologic: Grossly intact via light touch bilateral. Vibratory intact via tuning fork bilateral. Protective threshold with Semmes Wienstein monofilament intact to all pedal sites bilateral. Patellar and Achilles deep tendon reflexes 2+ bilateral. No Babinski or clonus noted bilateral.   Musculoskeletal: No gross boney pedal deformities bilateral. No pain, crepitus, or  limitation noted with foot and ankle range of motion bilateral. Muscular strength 5/5 in all groups tested bilateral.  Adductovarus rotation fifth digit left foot resulting in benign skin lesion.  Gait: Unassisted, Nonantalgic.    Radiographs:  Radiographs taken today left foot demonstrate an osseously mature individual with adductovarus rotated hammertoe deformity minimal spurring is noted.  No acute findings and good bone mineralization throughout  Assessment & Plan:   Assessment: This is not an ingrown toenail but a porokeratotic lesion the medial aspect of the hammertoe deformity fifth left.  Plan: Debrided the area today discussed the need for surgical intervention placed silicone padding.     Scott Sherman T. [place], [place]

## 2023-11-21 ENCOUNTER — Other Ambulatory Visit: Payer: Self-pay | Admitting: Urology

## 2023-11-21 DIAGNOSIS — R972 Elevated prostate specific antigen [PSA]: Secondary | ICD-10-CM

## 2023-12-11 ENCOUNTER — Ambulatory Visit
Admission: RE | Admit: 2023-12-11 | Discharge: 2023-12-11 | Disposition: A | Discharge status: Home / Self Care | Payer: Medicare Other | Source: Ambulatory Visit | Attending: Urology | Admitting: Urology

## 2023-12-11 DIAGNOSIS — R972 Elevated prostate specific antigen [PSA]: Secondary | ICD-10-CM

## 2023-12-11 MED ORDER — GADOPICLENOL 0.5 MMOL/ML IV SOLN
9.0000 mL | Freq: Once | INTRAVENOUS | Status: AC | PRN
  Administered 2023-12-11: 9 mL via INTRAVENOUS
